# Patient Record
Sex: Female | Born: 1975 | Race: Black or African American | Hispanic: No | Smoking: Current every day smoker
Health system: Southern US, Community
[De-identification: ages and names within clinical notes are randomized; demographics above are authoritative.]

## PROBLEM LIST (undated history)

## (undated) ENCOUNTER — Inpatient Hospital Stay (HOSPITAL_COMMUNITY): Payer: Self-pay

## (undated) DIAGNOSIS — Z789 Other specified health status: Secondary | ICD-10-CM

## (undated) HISTORY — DX: Other specified health status: Z78.9

## (undated) HISTORY — PX: NO PAST SURGERIES: SHX2092

---

## 2001-10-04 ENCOUNTER — Emergency Department (HOSPITAL_COMMUNITY): Admission: EM | Admit: 2001-10-04 | Discharge: 2001-10-04 | Payer: Self-pay | Admitting: Emergency Medicine

## 2001-10-04 ENCOUNTER — Encounter: Payer: Self-pay | Admitting: Emergency Medicine

## 2002-06-01 ENCOUNTER — Emergency Department (HOSPITAL_COMMUNITY): Admission: EM | Admit: 2002-06-01 | Discharge: 2002-06-01 | Payer: Self-pay | Admitting: Emergency Medicine

## 2005-01-05 ENCOUNTER — Other Ambulatory Visit: Admission: RE | Admit: 2005-01-05 | Discharge: 2005-01-05 | Payer: Self-pay | Admitting: Gynecology

## 2005-02-07 ENCOUNTER — Emergency Department (HOSPITAL_COMMUNITY): Admission: EM | Admit: 2005-02-07 | Discharge: 2005-02-07 | Payer: Self-pay | Admitting: Emergency Medicine

## 2005-10-06 ENCOUNTER — Other Ambulatory Visit: Admission: RE | Admit: 2005-10-06 | Discharge: 2005-10-06 | Payer: Self-pay | Admitting: Gynecology

## 2006-06-21 ENCOUNTER — Emergency Department (HOSPITAL_COMMUNITY): Admission: EM | Admit: 2006-06-21 | Discharge: 2006-06-21 | Payer: Self-pay | Admitting: Family Medicine

## 2007-07-28 ENCOUNTER — Emergency Department (HOSPITAL_COMMUNITY): Admission: EM | Admit: 2007-07-28 | Discharge: 2007-07-28 | Payer: Self-pay | Admitting: Emergency Medicine

## 2007-12-16 ENCOUNTER — Emergency Department (HOSPITAL_COMMUNITY): Admission: EM | Admit: 2007-12-16 | Discharge: 2007-12-16 | Payer: Self-pay | Admitting: Emergency Medicine

## 2007-12-17 ENCOUNTER — Emergency Department (HOSPITAL_COMMUNITY): Admission: EM | Admit: 2007-12-17 | Discharge: 2007-12-17 | Payer: Self-pay | Admitting: Emergency Medicine

## 2007-12-24 ENCOUNTER — Emergency Department (HOSPITAL_COMMUNITY): Admission: EM | Admit: 2007-12-24 | Discharge: 2007-12-24 | Payer: Self-pay | Admitting: Emergency Medicine

## 2008-11-07 ENCOUNTER — Emergency Department (HOSPITAL_COMMUNITY): Admission: EM | Admit: 2008-11-07 | Discharge: 2008-11-07 | Payer: Self-pay | Admitting: Emergency Medicine

## 2009-01-20 ENCOUNTER — Emergency Department (HOSPITAL_COMMUNITY): Admission: EM | Admit: 2009-01-20 | Discharge: 2009-01-20 | Payer: Self-pay | Admitting: Family Medicine

## 2009-10-30 ENCOUNTER — Emergency Department (HOSPITAL_COMMUNITY): Admission: EM | Admit: 2009-10-30 | Discharge: 2009-10-30 | Payer: Self-pay | Admitting: Family Medicine

## 2010-06-26 LAB — COMPREHENSIVE METABOLIC PANEL
ALT: 12 U/L (ref 0–35)
BUN: 12 mg/dL (ref 6–23)
CO2: 25 mEq/L (ref 19–32)
Calcium: 9.3 mg/dL (ref 8.4–10.5)
Creatinine, Ser: 0.8 mg/dL (ref 0.4–1.2)
GFR calc non Af Amer: >60 mL/min (ref >60)
Glucose, Bld: 96 mg/dL (ref 70–99)
Sodium: 138 mEq/L (ref 135–145)

## 2010-06-26 LAB — URINALYSIS, ROUTINE W REFLEX MICROSCOPIC
Bilirubin Urine: NEGATIVE
Glucose, UA: NEGATIVE mg/dL
Hgb urine dipstick: NEGATIVE
Ketones, ur: 40 mg/dL — AB
Nitrite: NEGATIVE
Protein, ur: NEGATIVE mg/dL
Specific Gravity, Urine: 1.03 (ref 1.005–1.030)
Urobilinogen, UA: 1 mg/dL (ref 0.0–1.0)
pH: 6 (ref 5.0–8.0)

## 2010-06-26 LAB — CBC
HCT: 37.5 % (ref 36.0–46.0)
Hemoglobin: 12.7 g/dL (ref 12.0–15.0)
MCHC: 33.9 g/dL (ref 30.0–36.0)
MCV: 85.2 fL (ref 78.0–100.0)
RBC: 4.4 MIL/uL (ref 3.87–5.11)

## 2010-06-26 LAB — DIFFERENTIAL
Eosinophils Absolute: 0.1 10*3/uL (ref 0.0–0.7)
Lymphs Abs: 1.2 10*3/uL (ref 0.7–4.0)
Neutrophils Relative %: 60 % (ref 43–77)

## 2010-06-26 LAB — GC/CHLAMYDIA PROBE AMP, GENITAL
Chlamydia, DNA Probe: NEGATIVE
GC Probe Amp, Genital: NEGATIVE

## 2010-07-21 ENCOUNTER — Emergency Department (HOSPITAL_COMMUNITY): Payer: Self-pay

## 2010-07-21 ENCOUNTER — Emergency Department (HOSPITAL_COMMUNITY)
Admission: EM | Admit: 2010-07-21 | Discharge: 2010-07-21 | Disposition: A | Discharge status: Home / Self Care | Payer: Self-pay | Attending: Emergency Medicine | Admitting: Emergency Medicine

## 2010-07-21 DIAGNOSIS — K029 Dental caries, unspecified: Secondary | ICD-10-CM | POA: Insufficient documentation

## 2010-07-21 DIAGNOSIS — F3289 Other specified depressive episodes: Secondary | ICD-10-CM | POA: Insufficient documentation

## 2010-07-21 DIAGNOSIS — R221 Localized swelling, mass and lump, neck: Secondary | ICD-10-CM | POA: Insufficient documentation

## 2010-07-21 DIAGNOSIS — Z7982 Long term (current) use of aspirin: Secondary | ICD-10-CM | POA: Insufficient documentation

## 2010-07-21 DIAGNOSIS — K089 Disorder of teeth and supporting structures, unspecified: Secondary | ICD-10-CM | POA: Insufficient documentation

## 2010-07-21 DIAGNOSIS — R220 Localized swelling, mass and lump, head: Secondary | ICD-10-CM | POA: Insufficient documentation

## 2010-07-21 DIAGNOSIS — F329 Major depressive disorder, single episode, unspecified: Secondary | ICD-10-CM | POA: Insufficient documentation

## 2012-06-14 ENCOUNTER — Encounter (HOSPITAL_COMMUNITY): Payer: Self-pay | Admitting: *Deleted

## 2012-06-14 ENCOUNTER — Emergency Department (INDEPENDENT_AMBULATORY_CARE_PROVIDER_SITE_OTHER)
Admission: EM | Admit: 2012-06-14 | Discharge: 2012-06-14 | Disposition: A | Discharge status: Home / Self Care | Payer: Self-pay | Source: Home / Self Care | Attending: Family Medicine | Admitting: Family Medicine

## 2012-06-14 DIAGNOSIS — H00015 Hordeolum externum left lower eyelid: Secondary | ICD-10-CM

## 2012-06-14 DIAGNOSIS — H00019 Hordeolum externum unspecified eye, unspecified eyelid: Secondary | ICD-10-CM

## 2012-06-14 DIAGNOSIS — K047 Periapical abscess without sinus: Secondary | ICD-10-CM

## 2012-06-14 MED ORDER — TRAMADOL HCL 50 MG PO TABS: 50.0000 mg | ORAL_TABLET | Freq: Four times a day (QID) | ORAL | Status: DC | PRN

## 2012-06-14 MED ORDER — ERYTHROMYCIN 5 MG/GM OP OINT: TOPICAL_OINTMENT | OPHTHALMIC | Status: DC

## 2012-06-14 MED ORDER — IBUPROFEN 600 MG PO TABS: 600.0000 mg | ORAL_TABLET | Freq: Three times a day (TID) | ORAL | Status: DC | PRN

## 2012-06-14 MED ORDER — PENICILLIN V POTASSIUM 500 MG PO TABS: 500.0000 mg | ORAL_TABLET | Freq: Four times a day (QID) | ORAL | Status: AC

## 2012-06-14 NOTE — ED Provider Notes (Signed)
History     CSN: [id number]  Arrival date & time 06/14/12  1024   First MD Initiated Contact with Patient 06/14/12 1042      Chief Complaint  Patient presents with  . Dental Problem    (Consider location/radiation/quality/duration/timing/severity/associated sxs/prior treatment) HPI Comments: 37 year old nondiabetic female here complaining of right lower jaw pain and gum swelling for 2 days. She has a cavity in the posterior molar for several months desk is intermittent dental pain. Denies face swelling, fever or chills. Also patient noticed an area in her left lower lid initially with itchiness and now with mild swelling and tenderness for 1 day, no spontaneous drainage.Marland Kitchen   History reviewed. No pertinent past medical history.  History reviewed. No pertinent past surgical history.  History reviewed. No pertinent family history.  History  Substance Use Topics  . Smoking status: Current Every Day Smoker  . Smokeless tobacco: Not on file  . Alcohol Use: Yes    OB History   Grav Para Term Preterm Abortions TAB SAB Ect Mult Living                  Review of Systems  Constitutional: Negative for fever and chills.  HENT: Positive for dental problem. Negative for congestion.   Eyes: Negative for photophobia, discharge, redness and visual disturbance.       Left eyelid itchiness and pain as per HPI  Respiratory: Negative for cough, shortness of breath and wheezing.   Gastrointestinal: Negative for nausea and vomiting.  Skin: Negative for rash.  Neurological: Negative for dizziness and headaches.    Allergies  Review of patient's allergies indicates no known allergies.  Home Medications   Current Outpatient Rx  Name  Route  Sig  Dispense  Refill  . erythromycin ophthalmic ointment      Place a 1/2 inch ribbon of ointment into the left lower eyelid TID for 5-7 days.   1 g   0   . ibuprofen (ADVIL,MOTRIN) 600 MG tablet   Oral   Take 1 tablet (600 mg total) by mouth  every 8 (eight) hours as needed for pain.   20 tablet   0   . penicillin v potassium (VEETID) 500 MG tablet   Oral   Take 1 tablet (500 mg total) by mouth 4 (four) times daily.   40 tablet   0   . traMADol (ULTRAM) 50 MG tablet   Oral   Take 1 tablet (50 mg total) by mouth every 6 (six) hours as needed for pain.   15 tablet   0     BP 155/97  Pulse 67  Temp(Src) 98.1 F (36.7 C) (Oral)  Resp 16  SpO2 97%  LMP 06/02/2012  Physical Exam  Nursing note and vitals reviewed. Constitutional: She is oriented to person, place, and time. She appears well-developed and well-nourished. No distress.  HENT:  Head: Normocephalic and atraumatic.  Right lower posterior molar with large cavity and associated gum swelling and tenderness.  Eyes: Conjunctivae and EOM are normal. Pupils are equal, round, and reactive to light. Right eye exhibits no discharge. Left eye exhibits no discharge. No scleral icterus.  Left lower eyelid with a focal area of mild swelling and tenderness over lid margin. No significant blepharitis. No periorbital swelling, induration tenderness or redness.  Neck: Neck supple.  Cardiovascular: Normal heart sounds.   Pulmonary/Chest: Breath sounds normal.  Lymphadenopathy:    She has no cervical adenopathy.  Neurological: She is alert and oriented to  person, place, and time.  Skin: No rash noted. She is not diaphoretic.    ED Course  Procedures (including critical care time)  Labs Reviewed - No data to display No results found.   1. Dental abscess   2. Hordeolum externum of left lower eyelid       MDM  Treated with penicillin, ibuprofen and tramadol.  Patient states she has a dentist that accepts her Medicaid and she plans to schedule a followup with him. Prescribed erythromycin ophthalmic ointment. Supportive care with oxygen return to medical attention discussed with patient and provided in writing.        Sharin Grave, MD 06/15/12 1224

## 2012-06-14 NOTE — ED Notes (Signed)
Pt  Has two  Complaints  She  Reports   A  Swelling to left  Lowe  Eyelid   X  1  Day    And   painfull  r  Lower  Jaw  With  Some   Numbness  In that  Area  For  2  Days      -   She  Is  Sitting  Upright on  Exam table  Speaking in  Complete  sentances   And  Is  In no acute  Distress

## 2015-03-22 NOTE — L&D Delivery Note (Addendum)
Patient is a 40 y.o. now G2P2002 who presented with SOL, now s/p SVD.   Delivery Note At 1:27 AM a viable female was delivered via Vaginal, Spontaneous Delivery. Head delivered LOA. Nuchal cord x 1 present, shoulders and body easily delivered through. Infant to mother's body. Cord clamped x 2 after 1-minute delay, and cut by mother. Cord blood drawn. Placenta delivered spontaneously, intact. Fundus firm with massage and Pitocin. Perineal inspected and found to be intact..    APGAR: pending; weight pending.   Placenta status: intact, with calcifications.  Cord: 3-vessel  Anesthesia: Epidural   Episiotomy: None Lacerations: None Suture Repair: none Est. Blood Loss (mL):  150  Mom to postpartum.  Baby to Couplet care / Skin to Skin.  [name] 12/02/2015, 1:47 AM    OB FELLOW DELIVERY ATTESTATION  I was gloved and present for the delivery in its entirety, and I agree with the above resident's note.    Jen MowElizabeth Jolin Benavides, DO OB Fellow 10:17 AM

## 2015-07-22 ENCOUNTER — Encounter: Payer: Self-pay | Admitting: Student

## 2015-08-10 ENCOUNTER — Ambulatory Visit (INDEPENDENT_AMBULATORY_CARE_PROVIDER_SITE_OTHER): Payer: Medicaid Other | Admitting: Obstetrics & Gynecology

## 2015-08-10 ENCOUNTER — Other Ambulatory Visit (HOSPITAL_COMMUNITY)
Admission: RE | Admit: 2015-08-10 | Discharge: 2015-08-10 | Disposition: A | Discharge status: Home / Self Care | Payer: Medicaid Other | Source: Ambulatory Visit | Attending: Obstetrics & Gynecology | Admitting: Obstetrics & Gynecology

## 2015-08-10 ENCOUNTER — Other Ambulatory Visit: Payer: Self-pay | Admitting: Obstetrics & Gynecology

## 2015-08-10 ENCOUNTER — Encounter: Payer: Self-pay | Admitting: Obstetrics & Gynecology

## 2015-08-10 VITALS — BP 120/66 | HR 77 | Ht 64.0 in | Wt 131.0 lb

## 2015-08-10 DIAGNOSIS — O09529 Supervision of elderly multigravida, unspecified trimester: Secondary | ICD-10-CM | POA: Insufficient documentation

## 2015-08-10 DIAGNOSIS — Z1151 Encounter for screening for human papillomavirus (HPV): Secondary | ICD-10-CM | POA: Diagnosis not present

## 2015-08-10 DIAGNOSIS — O0992 Supervision of high risk pregnancy, unspecified, second trimester: Secondary | ICD-10-CM

## 2015-08-10 DIAGNOSIS — O09522 Supervision of elderly multigravida, second trimester: Secondary | ICD-10-CM | POA: Diagnosis not present

## 2015-08-10 DIAGNOSIS — Z01419 Encounter for gynecological examination (general) (routine) without abnormal findings: Secondary | ICD-10-CM | POA: Diagnosis present

## 2015-08-10 DIAGNOSIS — Z113 Encounter for screening for infections with a predominantly sexual mode of transmission: Secondary | ICD-10-CM | POA: Insufficient documentation

## 2015-08-10 DIAGNOSIS — O0932 Supervision of pregnancy with insufficient antenatal care, second trimester: Secondary | ICD-10-CM | POA: Diagnosis present

## 2015-08-10 DIAGNOSIS — O3680X0 Pregnancy with inconclusive fetal viability, not applicable or unspecified: Secondary | ICD-10-CM | POA: Insufficient documentation

## 2015-08-10 LAB — POCT URINALYSIS DIP (DEVICE)
BILIRUBIN URINE: NEGATIVE
Glucose, UA: NEGATIVE mg/dL
Hgb urine dipstick: NEGATIVE
Ketones, ur: NEGATIVE mg/dL
Nitrite: NEGATIVE
PH: 6 (ref 5.0–8.0)
Protein, ur: NEGATIVE mg/dL
Specific Gravity, Urine: 1.025 (ref 1.005–1.030)
UROBILINOGEN UA: 1 mg/dL (ref 0.0–1.0)

## 2015-08-10 NOTE — Progress Notes (Signed)
U/S and genetic counseling scheduled 06/02 @ 1pm.

## 2015-08-10 NOTE — Progress Notes (Signed)
   Subjective:    Shelly Hodges is a G2P1001 8410w4d being seen today for her first obstetrical visit.  Her obstetrical history is significant for advanced maternal age and late prenatal care. Patient does intend to breast feed. Pregnancy history fully reviewed.  Patient reports no complaints.  Filed Vitals:   08/10/15 0939 08/10/15 0940  BP: 120/66   Pulse: 77   Height:  5\' 4"  (1.626 m)  Weight: 131 lb (59.421 kg)     HISTORY: OB History  Gravida Para Term Preterm AB SAB TAB Ectopic Multiple Living  2 1 1       1     # Outcome Date GA Lbr Len/2nd Weight Sex Delivery Anes PTL Lv  2 Current           1 Term 10/20/95 682w0d  5 lb 11 oz (2.58 kg) M Vag-Spont EPI  Y     Past Medical History  Diagnosis Date  . Medical history non-contributory    Past Surgical History  Procedure Laterality Date  . No past surgeries     History reviewed. No pertinent family history.   Exam    Uterus:  Fundal Height: 21 cm  Pelvic Exam:    Perineum: Normal Perineum   Vulva: normal   Vagina:  normal mucosa   pH: n/a   Cervix: no lesions   Adnexa: normal adnexa   Bony Pelvis: average  System: Breast:  pt refuses breast exam at this time   Skin: normal coloration and turgor, no rashes    Neurologic: oriented, normal mood   Extremities: no deformities   HEENT oropharynx clear, no lesions, neck supple with midline trachea and thyroid without masses   Mouth/Teeth mucous membranes moist, pharynx normal without lesions and dental hygiene poor   Neck supple and no masses   Cardiovascular: regular rate and rhythm   Respiratory:  appears well, vitals normal, no respiratory distress, acyanotic, normal RR, chest clear, no wheezing, crepitations, rhonchi, normal symmetric air entry   Abdomen: soft, non-tender; bowel sounds normal; no masses,  no organomegaly   Urinary: urethral meatus normal      Assessment:    Pregnancy: G2P1001 Patient Active Problem List   Diagnosis Date Noted  .  Antepartum multigravida of advanced maternal age 42/22/2017  . Supervision of high risk pregnancy, antepartum 08/10/2015        Plan:     Initial labs drawn. Prenatal vitamins. Problem list reviewed and updated. Genetic Screening discussed and patient wants to have NIPS and genetic counseling  Ultrasound discussed; fetal survey: ordered. Early glucola due to AMA Pap smear with co testing today  Follow up in 4 weeks.    Mammie Meras H. 08/10/2015

## 2015-08-11 LAB — CYTOLOGY - PAP

## 2015-08-11 LAB — GLUCOSE TOLERANCE, 1 HOUR (50G) W/O FASTING: Glucose, 1 Hr, gestational: 89 mg/dL (ref <140)

## 2015-08-11 LAB — GC/CHLAMYDIA PROBE AMP (~~LOC~~) NOT AT ARMC
Chlamydia: NEGATIVE
NEISSERIA GONORRHEA: NEGATIVE

## 2015-08-12 LAB — PRENATAL PROFILE (SOLSTAS)
Antibody Screen: NEGATIVE
BASOS PCT: 0 %
Basophils Absolute: 0 cells/uL (ref 0–200)
EOS PCT: 2 %
Eosinophils Absolute: 138 cells/uL (ref 15–500)
HEMATOCRIT: 31.1 % — AB (ref 35.0–45.0)
HEMOGLOBIN: 10.1 g/dL — AB (ref 11.7–15.5)
HIV 1&2 Ab, 4th Generation: NONREACTIVE
Hepatitis B Surface Ag: NEGATIVE
LYMPHS ABS: 1380 {cells}/uL (ref 850–3900)
LYMPHS PCT: 20 %
MCH: 26.7 pg — ABNORMAL LOW (ref 27.0–33.0)
MCHC: 32.5 g/dL (ref 32.0–36.0)
MCV: 82.3 fL (ref 80.0–100.0)
MPV: 10.3 fL (ref 7.5–12.5)
Monocytes Absolute: 483 cells/uL (ref 200–950)
Monocytes Relative: 7 %
NEUTROS PCT: 71 %
Neutro Abs: 4899 cells/uL (ref 1500–7800)
PLATELETS: 274 10*3/uL (ref 140–400)
RBC: 3.78 MIL/uL — AB (ref 3.80–5.10)
RDW: 15.1 % — AB (ref 11.0–15.0)
RH TYPE: POSITIVE
Rubella: 2.68 Index — ABNORMAL HIGH (ref <0.90)
WBC: 6.9 10*3/uL (ref 3.8–10.8)

## 2015-08-12 LAB — CULTURE, OB URINE

## 2015-08-14 ENCOUNTER — Encounter: Payer: Self-pay | Admitting: *Deleted

## 2015-08-14 ENCOUNTER — Other Ambulatory Visit: Payer: Self-pay | Admitting: Obstetrics & Gynecology

## 2015-08-14 LAB — ZOLPIDEM QN, U
ZOLPIDEM METABOLITE: NEGATIVE ng/mL (ref <5)
ZOLPIDEM: NEGATIVE ng/mL (ref <5)

## 2015-08-14 LAB — PAIN MGMT, TRAMADOL QN, U
DESMETHYLTRAMADOL: NEGATIVE ng/mL (ref <100)
TRAMADOL: NEGATIVE ng/mL (ref <100)

## 2015-08-14 LAB — PAIN MGMT, FENTANYL QN, U
Fentanyl: NEGATIVE ng/mL (ref <0.5)
Norfentanyl: NEGATIVE ng/mL (ref <0.5)

## 2015-08-14 LAB — PAIN MGMT, MEPERIDINE QN, U
Meperidine: NEGATIVE ng/mL (ref <100)
Normeperidine: NEGATIVE ng/mL (ref <100)

## 2015-08-14 LAB — PAIN MGMT, CARISOPRODOL MET. QN, U: MEPROBAMATE: NEGATIVE ng/mL (ref <1000)

## 2015-08-14 MED ORDER — METRONIDAZOLE 500 MG PO TABS: ORAL_TABLET | ORAL | Status: DC

## 2015-08-15 LAB — PAIN MGMT, AMPHETAM. W/CONF, U: Amphetamines: NEGATIVE ng/mL (ref <500)

## 2015-08-15 LAB — PAIN MGMT, PROPOXYPHENE W/CONF, U: PROPOXYPHENE: NEGATIVE ng/mL (ref <300)

## 2015-08-15 LAB — PAIN MGMT, HEROIN MET. W/CONF, U: 6 ACETYLMORPHINE: NEGATIVE ng/mL (ref <10)

## 2015-08-15 LAB — PAIN MGMT, OPIATES W/CONF, U: OPIATES: NEGATIVE ng/mL (ref <100)

## 2015-08-15 LAB — PAIN MGMT, BUP CONF W/ NALOX, U: Buprenorphine: NEGATIVE ng/mL (ref <5)

## 2015-08-15 LAB — PAIN MGMT, METHADONE W/CONF, U: Methadone Metabolite: NEGATIVE ng/mL (ref <100)

## 2015-08-15 LAB — PAIN MGMT, OXYCODONE W/CONF, U: OXYCODONE: NEGATIVE ng/mL (ref <100)

## 2015-08-15 LAB — PAIN MGMT, BENZOS W/CONF, U: BENZODIAZEPINES: NEGATIVE ng/mL (ref <100)

## 2015-08-15 LAB — PAIN MGMT, BARBITURATES W/CONF, U: BARBITURATES: NEGATIVE ng/mL (ref <300)

## 2015-08-15 LAB — PAIN MGMT, MARIJUANA W/CONF, U
MARIJUANA METABOLITE: 1210 ng/mL — AB (ref <5)
Marijuana Metabolite: POSITIVE ng/mL — AB (ref <20)

## 2015-08-15 LAB — PAIN MGMT, COCAINE MET. W/CONF, U: COCAINE METABOLITE: NEGATIVE ng/mL (ref <150)

## 2015-08-16 LAB — CANNABANOIDS (GC/LC/MS), URINE: THC-COOH UR CONFIRM: 829 ng/mL — AB (ref <5)

## 2015-08-18 ENCOUNTER — Ambulatory Visit (HOSPITAL_COMMUNITY): Payer: Medicaid Other

## 2015-08-18 LAB — PRESCRIPTION MONITORING PROFILE (19 PANEL)
AMPHETAMINE/METH: NEGATIVE ng/mL
BARBITURATE SCREEN, URINE: NEGATIVE ng/mL
Benzodiazepine Screen, Urine: NEGATIVE ng/mL
Buprenorphine, Urine: NEGATIVE ng/mL
CARISOPRODOL, URINE: NEGATIVE ng/mL
Cocaine Metabolites: NEGATIVE ng/mL
Creatinine, Urine: 172.49 mg/dL (ref >20.0)
Fentanyl, Ur: NEGATIVE ng/mL
MDMA URINE: NEGATIVE ng/mL
MEPERIDINE UR: NEGATIVE ng/mL
METHADONE SCREEN, URINE: NEGATIVE ng/mL
Methaqualone: NEGATIVE ng/mL
NITRITES URINE, INITIAL: NEGATIVE ug/mL
OPIATE SCREEN, URINE: NEGATIVE ng/mL
OXYCODONE SCRN UR: NEGATIVE ng/mL
PHENCYCLIDINE, UR: NEGATIVE ng/mL
PROPOXYPHENE: NEGATIVE ng/mL
TAPENTADOLUR: NEGATIVE ng/mL
TRAMADOL UR: NEGATIVE ng/mL
Zolpidem, Urine: NEGATIVE ng/mL
pH, Initial: 6.3 pH (ref 4.5–8.9)

## 2015-08-19 ENCOUNTER — Encounter: Payer: Self-pay | Admitting: Obstetrics & Gynecology

## 2015-08-19 DIAGNOSIS — F1290 Cannabis use, unspecified, uncomplicated: Secondary | ICD-10-CM | POA: Insufficient documentation

## 2015-08-19 NOTE — Progress Notes (Signed)
Called patient & informed of her medication sent to pharmacy & results. Also discussed abstaining from intercourse & partner treatment. Patient verbalized understanding & had no questions

## 2015-08-21 ENCOUNTER — Encounter (HOSPITAL_COMMUNITY): Payer: Self-pay

## 2015-08-21 ENCOUNTER — Ambulatory Visit (HOSPITAL_COMMUNITY)
Admission: RE | Admit: 2015-08-21 | Discharge: 2015-08-21 | Disposition: A | Discharge status: Home / Self Care | Payer: Medicaid Other | Source: Ambulatory Visit | Attending: Obstetrics & Gynecology | Admitting: Obstetrics & Gynecology

## 2015-08-21 DIAGNOSIS — O0992 Supervision of high risk pregnancy, unspecified, second trimester: Secondary | ICD-10-CM

## 2015-08-21 DIAGNOSIS — O0932 Supervision of pregnancy with insufficient antenatal care, second trimester: Secondary | ICD-10-CM

## 2015-08-21 DIAGNOSIS — Z3A25 25 weeks gestation of pregnancy: Secondary | ICD-10-CM | POA: Diagnosis not present

## 2015-08-21 DIAGNOSIS — O09522 Supervision of elderly multigravida, second trimester: Secondary | ICD-10-CM

## 2015-08-21 DIAGNOSIS — O09529 Supervision of elderly multigravida, unspecified trimester: Secondary | ICD-10-CM

## 2015-08-21 DIAGNOSIS — O0990 Supervision of high risk pregnancy, unspecified, unspecified trimester: Secondary | ICD-10-CM

## 2015-08-21 NOTE — Progress Notes (Signed)
Genetic Counseling  High-Risk Gestation Note  Appointment Date:  08/21/2015 Referred By: Shelly Dukes, MD Date of Birth:  13-Oct-1975   Pregnancy History: G2P1001 Estimated Date of Delivery: 12/03/15 Estimated Gestational Age: [redacted]w[redacted]d Attending: Ledon Snare, MD  [name] was seen for genetic counseling because of a maternal age of 40 y.o..     In summary:  Discussed AMA and associated risk for fetal aneuploidy  Discussed options for screening  NIPS-declined  Ultrasound-within normal limits today; complete report under separate cover  Discussed diagnostic testing options  Amniocentesis-patient declined  Reviewed family history concerns: two maternal aunts with sickle cell trait  Discussed carrier screening options  CF-patient declined  SMA-patient declined  Hemoglobinopathies- patient accepted; Hemoglobin electrophoresis performed today  She was counseled regarding maternal age and the association with risk for chromosome conditions due to nondisjunction with aging of the ova.   We reviewed chromosomes, nondisjunction, and the associated 1 in 33 risk for fetal aneuploidy related to a maternal age of 40 y.o. at [redacted]w[redacted]d gestation.  She was counseled that the risk for aneuploidy decreases as gestational age increases, accounting for those pregnancies which spontaneously abort.  We specifically discussed Down syndrome (trisomy 55), trisomies 57 and 36, and sex chromosome aneuploidies (47,XXX and 47,XXY) including the common features and prognoses of each.   We reviewed available screening options including noninvasive prenatal screening (NIPS)/cell free DNA (cfDNA) screening and detailed ultrasound.  She was counseled that screening tests are used to modify a patient's a priori risk for aneuploidy, typically based on age. This estimate provides a pregnancy specific risk assessment. We reviewed the benefits and limitations of each option. Specifically, we discussed the  conditions for which each test screens, the detection rates, and false positive rates of each. She was also counseled regarding diagnostic testing via amniocentesis. We reviewed the approximate 1 in 300-500 risk for complications from amniocentesis, including spontaneous preterm labor and delivery. We discussed the possible results that the tests might provide including: positive, negative, unanticipated, and no result. Finally, they were counseled regarding the cost of each option and potential out of pocket expenses.  Detailed ultrasound was performed today. Visualized fetal anatomy was within normal range. Complete ultrasound results reported separately.    After consideration of all the options, she declined NIPS and amniocentesis. She understands that screening tests cannot rule out all birth defects or genetic syndromes. The patient was advised of this limitation and states she still does not want additional testing at this time.   [name] was provided with written information regarding sickle cell anemia (SCA) including the carrier frequency and incidence in the African-American population, the availability of carrier testing and prenatal diagnosis if indicated. She reported that she has two maternal aunts and one maternal first cousin with sickle cell trait. The father of the pregnancy does not have known relatives with sickle cell trait, and consanguinity was denied for the couple. We reviewed the autosomal recessive inheritance of hemoglobin variants, such as sickle cell trait, and that prior to carrier screening, Shelly Hodges would have a 1 in 4 (25%) chance to have sickle cell trait given the reported family history. We reviewed that carrier screening is available to Shelly Hodges via hemoglobin electrophoresis.  In addition, we discussed that hemoglobinopathies are routinely screened for as part of the [place] [facility] newborn screening panel.  She elected to pursue hemoglobin electrophoresis  today.  [name] was provided with written information regarding cystic fibrosis (CF) including  the carrier frequency and incidence in the African American population, the availability of carrier testing and prenatal diagnosis if indicated.  In addition, we discussed that CF is routinely screened for as part of the [facility] newborn screening panel.  She declined CF testing today. We also discussed the option of carrier screening for Spinal Muscular Atrophy. We reviewed the autosomal recessive inheritance, general population carrier frequency, and variable features of SMA as a condition. She declined SMA carrier screening today.   Both family histories were reviewed and found to be otherwise noncontributory for birth defects, intellectual disability, and known genetic conditions. Without further information regarding the provided family history, an accurate genetic risk cannot be calculated. Further genetic counseling is warranted if more information is obtained.  [name] denied exposure to environmental toxins or chemical agents. She denied the use of alcohol, tobacco or street drugs. She denied significant viral illnesses during the course of her pregnancy. Her medical and surgical histories were noncontributory.   I counseled [name] regarding the above risks and available options.  The approximate face-to-face time with the genetic counselor was 35 minutes.  Quinn PlowmanKaren Cassell Voorhies, MS,  Certified Genetic Counselor 08/21/2015

## 2015-08-25 LAB — HEMOGLOBINOPATHY EVALUATION
HGB A: 97.5 % (ref 94.0–98.0)
HGB F QUANT: 0 % (ref 0.0–2.0)
HGB S QUANTITAION: 0 %
Hgb A2 Quant: 1.1 % (ref 0.7–3.1)
Hgb C: 0 %

## 2015-08-31 ENCOUNTER — Telehealth (HOSPITAL_COMMUNITY): Payer: Self-pay | Admitting: *Deleted

## 2015-08-31 NOTE — Telephone Encounter (Signed)
Pt called, name and DOB verified, results of lab given.  Pt understanding, no questions at this time.  Dr. Sherrie Georgeecker rev'd lab results.

## 2015-09-07 ENCOUNTER — Ambulatory Visit (INDEPENDENT_AMBULATORY_CARE_PROVIDER_SITE_OTHER): Payer: Medicaid Other | Admitting: Obstetrics & Gynecology

## 2015-09-07 VITALS — BP 126/66 | HR 72 | Wt 134.2 lb

## 2015-09-07 DIAGNOSIS — O0992 Supervision of high risk pregnancy, unspecified, second trimester: Secondary | ICD-10-CM

## 2015-09-07 DIAGNOSIS — O09522 Supervision of elderly multigravida, second trimester: Secondary | ICD-10-CM | POA: Diagnosis not present

## 2015-09-07 DIAGNOSIS — Z23 Encounter for immunization: Secondary | ICD-10-CM | POA: Diagnosis not present

## 2015-09-07 LAB — CBC
HEMATOCRIT: 30.6 % — AB (ref 35.0–45.0)
HEMOGLOBIN: 9.9 g/dL — AB (ref 11.7–15.5)
MCH: 25.9 pg — ABNORMAL LOW (ref 27.0–33.0)
MCHC: 32.4 g/dL (ref 32.0–36.0)
MCV: 80.1 fL (ref 80.0–100.0)
MPV: 10.4 fL (ref 7.5–12.5)
Platelets: 280 10*3/uL (ref 140–400)
RBC: 3.82 MIL/uL (ref 3.80–5.10)
RDW: 14.8 % (ref 11.0–15.0)
WBC: 6.9 10*3/uL (ref 3.8–10.8)

## 2015-09-07 LAB — POCT URINALYSIS DIP (DEVICE)
BILIRUBIN URINE: NEGATIVE
GLUCOSE, UA: NEGATIVE mg/dL
Hgb urine dipstick: NEGATIVE
KETONES UR: NEGATIVE mg/dL
LEUKOCYTES UA: NEGATIVE
Nitrite: NEGATIVE
Protein, ur: 30 mg/dL — AB
SPECIFIC GRAVITY, URINE: 1.025 (ref 1.005–1.030)
Urobilinogen, UA: 1 mg/dL (ref 0.0–1.0)
pH: 7 (ref 5.0–8.0)

## 2015-09-07 LAB — GLUCOSE TOLERANCE, 1 HOUR (50G) W/O FASTING: GLUCOSE, 1 HR, GESTATIONAL: 130 mg/dL (ref <140)

## 2015-09-07 LAB — HIV ANTIBODY (ROUTINE TESTING W REFLEX): HIV: NONREACTIVE

## 2015-09-07 NOTE — Patient Instructions (Signed)
Sterilization Information, Female Female sterilization is a procedure to permanently prevent pregnancy. There are different ways to perform sterilization, but all either block or close the fallopian tubes so that your eggs cannot reach your uterus. If your egg cannot reach your uterus, sperm cannot fertilize the egg, and you cannot get pregnant.  Sterilization is performed by a surgical procedure. Sometimes these procedures are performed in a hospital while a patient is asleep. Sometimes they can be done in a clinic setting with the patient awake. The fallopian tubes can be surgically cut, tied, or sealed through a procedure called tubal ligation. The fallopian tubes can also be closed with clips or rings. Sterilization can also be done by placing a tiny coil into each fallopian tube, which causes scar tissue to grow inside the tube. The scar tissue then blocks the tubes.  Discuss sterilization with your caregiver to answer any concerns you or your partner may have. You may want to ask what type of sterilization your caregiver performs. Some caregivers may not perform all the various options. Sterilization is permanent and should only be done if you are sure you do not want children or do not want any more children. Having a sterilization reversed may not be successful.  STERILIZATION PROCEDURES  Laparoscopic sterilization. This is a surgical method performed at a time other than right after childbirth. Two incisions are made in the lower abdomen. A thin, lighted tube (laparoscope) is inserted into one of the incisions and is used to perform the procedure. The fallopian tubes are closed with a ring or a clip. An instrument that uses heat could be used to seal the tubes closed (electrocautery).   Mini-laparotomy. This is a surgical method done 1 or 2 days after giving birth. Typically, a small incision is made just below the belly button (umbilicus) and the fallopian tubes are exposed. The tubes can then be  sealed, tied, or cut.   Hysteroscopic sterilization. This is performed at a time other than right after childbirth. A tiny, spring-like coil is inserted through the cervix and uterus and placed into the fallopian tubes. The coil causes scaring and blocks the tubes. Other forms of contraception should be used for 3 months after the procedure to allow the scar tissue to form completely. Additionally, it is required hysterosalpingography be done 3 months later to ensure that the procedure was successful. Hysterosalpingography is a procedure that uses X-rays to look at your uterus and fallopian tubes after a material to make them show up better has been inserted. IS STERILIZATION SAFE? Sterilization is considered safe with very rare complications. Risks depend on the type of procedure you have. As with any surgical procedure, there are risks. Some risks of sterilization by any means include:   Bleeding.  Infection.  Reaction to anesthesia medicine.  Injury to surrounding organs. Risks specific to having hysteroscopic coils placed include:  The coils may not be placed correctly the first time.   The coils may move out of place.   The tubes may not get completely blocked after 3 months.   Injury to surrounding organs when placing the coil.  HOW EFFECTIVE IS FEMALE STERILIZATION? Sterilization is nearly 100% effective, but it can fail. Depending on the type of sterilization, the rate of failure can be as high as 3%. After hysteroscopic sterilization with placement of fallopian tube coils, you will need back-up birth control for 3 months after the procedure. Sterilization is effective for a lifetime.  BENEFITS OF STERILIZATION  It does   not affect your hormones, and therefore will not affect your menstrual periods, sexual desire, or performance.   It is effective for a lifetime.   It is safe.   You do not need to worry about getting pregnant. Keep in mind that if you had the  hysteroscopic placement procedure, you must wait 3 months after the procedure (or until your caregiver confirms) before pregnancy is not considered possible.   There are no side effects unlike other types of birth control (contraception).  DRAWBACKS OF STERILIZATION  You must be sure you do not want children or any more children. The procedure is permanent.   It does not provide protection against sexually transmitted infections (STIs).   The tubes can grow back together. If this happens, there is a risk of pregnancy. There is also an increased risk (50%) of pregnancy being an ectopic pregnancy. This is a pregnancy that happens outside of the uterus.   This information is not intended to replace advice given to you by your health care provider. Make sure you discuss any questions you have with your health care provider.   Document Released: 08/24/2007 Document Revised: 03/12/2013 Document Reviewed: 06/23/2011 Elsevier Interactive Patient Education [employer] Contraception Choices Contraception (birth control) is the use of any methods or devices to prevent pregnancy. Below are some methods to help avoid pregnancy. HORMONAL METHODS   Contraceptive implant. This is a thin, plastic tube containing progesterone hormone. It does not contain estrogen hormone. Your health care provider inserts the tube in the inner part of the upper arm. The tube can remain in place for up to 3 years. After 3 years, the implant must be removed. The implant prevents the ovaries from releasing an egg (ovulation), thickens the cervical mucus to prevent sperm from entering the uterus, and thins the lining of the inside of the uterus.  Progesterone-only injections. These injections are given every 3 months by your health care provider to prevent pregnancy. This synthetic progesterone hormone stops the ovaries from releasing eggs. It also thickens cervical mucus and changes the uterine lining. This makes it  harder for sperm to survive in the uterus.  Birth control pills. These pills contain estrogen and progesterone hormone. They work by preventing the ovaries from releasing eggs (ovulation). They also cause the cervical mucus to thicken, preventing the sperm from entering the uterus. Birth control pills are prescribed by a health care provider.Birth control pills can also be used to treat heavy periods.  Minipill. This type of birth control pill contains only the progesterone hormone. They are taken every day of each month and must be prescribed by your health care provider.  Birth control patch. The patch contains hormones similar to those in birth control pills. It must be changed once a week and is prescribed by a health care provider.  Vaginal ring. The ring contains hormones similar to those in birth control pills. It is left in the vagina for 3 weeks, removed for 1 week, and then a new one is put back in place. The patient must be comfortable inserting and removing the ring from the vagina.A health care provider's prescription is necessary.  Emergency contraception. Emergency contraceptives prevent pregnancy after unprotected sexual intercourse. This pill can be taken right after sex or up to 5 days after unprotected sex. It is most effective the sooner you take the pills after having sexual intercourse. Most emergency contraceptive pills are available without a prescription. Check with your pharmacist. Do not use emergency contraception as  your only form of birth control. BARRIER METHODS   Female condom. This is a thin sheath (latex or rubber) that is worn over the penis during sexual intercourse. It can be used with spermicide to increase effectiveness.  Female condom. This is a soft, loose-fitting sheath that is put into the vagina before sexual intercourse.  Diaphragm. This is a soft, latex, dome-shaped barrier that must be fitted by a health care provider. It is inserted into the vagina,  along with a spermicidal jelly. It is inserted before intercourse. The diaphragm should be left in the vagina for 6 to 8 hours after intercourse.  Cervical cap. This is a round, soft, latex or plastic cup that fits over the cervix and must be fitted by a health care provider. The cap can be left in place for up to 48 hours after intercourse.  Sponge. This is a soft, circular piece of polyurethane foam. The sponge has spermicide in it. It is inserted into the vagina after wetting it and before sexual intercourse.  Spermicides. These are chemicals that kill or block sperm from entering the cervix and uterus. They come in the form of creams, jellies, suppositories, foam, or tablets. They do not require a prescription. They are inserted into the vagina with an applicator before having sexual intercourse. The process must be repeated every time you have sexual intercourse. INTRAUTERINE CONTRACEPTION  Intrauterine device (IUD). This is a T-shaped device that is put in a woman's uterus during a menstrual period to prevent pregnancy. There are 2 types:  Copper IUD. This type of IUD is wrapped in copper wire and is placed inside the uterus. Copper makes the uterus and fallopian tubes produce a fluid that kills sperm. It can stay in place for 10 years.  Hormone IUD. This type of IUD contains the hormone progestin (synthetic progesterone). The hormone thickens the cervical mucus and prevents sperm from entering the uterus, and it also thins the uterine lining to prevent implantation of a fertilized egg. The hormone can weaken or kill the sperm that get into the uterus. It can stay in place for 3-5 years, depending on which type of IUD is used. PERMANENT METHODS OF CONTRACEPTION  Female tubal ligation. This is when the woman's fallopian tubes are surgically sealed, tied, or blocked to prevent the egg from traveling to the uterus.  Hysteroscopic sterilization. This involves placing a small coil or insert into  each fallopian tube. Your doctor uses a technique called hysteroscopy to do the procedure. The device causes scar tissue to form. This results in permanent blockage of the fallopian tubes, so the sperm cannot fertilize the egg. It takes about 3 months after the procedure for the tubes to become blocked. You must use another form of birth control for these 3 months.  Female sterilization. This is when the female has the tubes that carry sperm tied off (vasectomy).This blocks sperm from entering the vagina during sexual intercourse. After the procedure, the man can still ejaculate fluid (semen). NATURAL PLANNING METHODS  Natural family planning. This is not having sexual intercourse or using a barrier method (condom, diaphragm, cervical cap) on days the woman could become pregnant.  Calendar method. This is keeping track of the length of each menstrual cycle and identifying when you are fertile.  Ovulation method. This is avoiding sexual intercourse during ovulation.  Symptothermal method. This is avoiding sexual intercourse during ovulation, using a thermometer and ovulation symptoms.  Post-ovulation method. This is timing sexual intercourse after you have ovulated.  Regardless of which type or method of contraception you choose, it is important that you use condoms to protect against the transmission of sexually transmitted infections (STIs). Talk with your health care provider about which form of contraception is most appropriate for you.   This information is not intended to replace advice given to you by your health care provider. Make sure you discuss any questions you have with your health care provider.   Document Released: 03/07/2005 Document Revised: 03/12/2013 Document Reviewed: 08/30/2012 Elsevier Interactive Patient Education [employer]

## 2015-09-07 NOTE — Progress Notes (Signed)
1 hour gtt given lab draw at

## 2015-09-07 NOTE — Progress Notes (Signed)
Subjective:  Shelly Hodges is a 40 y.o. G2P1001 at 1375w4d being seen today for ongoing prenatal care.  She is currently monitored for the following issues for this high-risk pregnancy and has Antepartum multigravida of advanced maternal age; Supervision of high risk pregnancy, antepartum; and Marijuana use on her problem list.  Patient reports pt worried about weight gain.  She doesn't know how much she weighed when she was pregnant.  May 120?  She has gained 3 pounds in 3.5 weeks which is reassuring.  Will wtch closely..  Contractions: Not present.  .  Movement: Present. Denies leaking of fluid.   The following portions of the patient's history were reviewed and updated as appropriate: allergies, current medications, past family history, past medical history, past social history, past surgical history and problem list. Problem list updated.  Objective:   Filed Vitals:   09/07/15 1109  BP: 126/66  Pulse: 72  Weight: 134 lb 3.2 oz (60.873 kg)    Fetal Status: Fetal Heart Rate (bpm): 154 Fundal Height: 26 cm Movement: Present     General:  Alert, oriented and cooperative. Patient is in no acute distress.  Skin: Skin is warm and dry. No rash noted.   Cardiovascular: Normal heart rate noted  Respiratory: Normal respiratory effort, no problems with respiration noted  Abdomen: Soft, gravid, appropriate for gestational age. Pain/Pressure: Absent     Pelvic: Cervical exam deferred        Extremities: Normal range of motion.  Edema: None  Mental Status: Normal mood and affect. Normal behavior. Normal judgment and thought content.   Urinalysis: Urine Protein: Negative Urine Glucose: Negative  Assessment and Plan:  Pregnancy: G2P1001 at 375w4d  1. Supervision of high risk pregnancy, antepartum, second trimester -tdap - CBC - RPR - HIV antibody - Glucose Tolerance, 1 HR (50g)  2.  Will watch weight gain  Preterm labor symptoms and general obstetric precautions including but not  limited to vaginal bleeding, contractions, leaking of fluid and fetal movement were reviewed in detail with the patient. Please refer to After Visit Summary for other counseling recommendations.  Return in about 2 days (around 09/09/2015).   Lesly DukesKelly H Pamalee Marcoe, MD

## 2015-09-08 ENCOUNTER — Encounter: Payer: Self-pay | Admitting: Obstetrics & Gynecology

## 2015-09-08 DIAGNOSIS — D649 Anemia, unspecified: Secondary | ICD-10-CM | POA: Insufficient documentation

## 2015-09-08 LAB — RPR

## 2015-09-09 ENCOUNTER — Telehealth: Payer: Self-pay

## 2015-09-09 NOTE — Telephone Encounter (Signed)
Per Dr. Penne LashLeggett, pt needs to start Fe for anemia and docusate sodium PRN.  Pt can purchase over the counter. Attempted to contact pt unable to LM due to phone keeps ringing without VM.

## 2015-09-15 NOTE — Telephone Encounter (Signed)
Called pt Shelly Hodges that this is our second attempt in trying to reach her please give the office a call and a letter will be sent.  Letter sent.

## 2015-09-15 NOTE — Progress Notes (Signed)
Pt returned call and I informed d/t anemia provider has recommended that she starts taking iron otc and docusate sodium PRN because it causes constipation.  Pt stated understanding with no further questions.  Did not send letter.

## 2015-09-23 ENCOUNTER — Encounter: Payer: Self-pay | Admitting: Advanced Practice Midwife

## 2015-09-23 ENCOUNTER — Ambulatory Visit (INDEPENDENT_AMBULATORY_CARE_PROVIDER_SITE_OTHER): Payer: Medicaid Other | Admitting: Advanced Practice Midwife

## 2015-09-23 VITALS — BP 102/68 | HR 75 | Wt 133.5 lb

## 2015-09-23 DIAGNOSIS — D649 Anemia, unspecified: Secondary | ICD-10-CM

## 2015-09-23 DIAGNOSIS — O0992 Supervision of high risk pregnancy, unspecified, second trimester: Secondary | ICD-10-CM

## 2015-09-23 DIAGNOSIS — D582 Other hemoglobinopathies: Secondary | ICD-10-CM | POA: Insufficient documentation

## 2015-09-23 DIAGNOSIS — O99013 Anemia complicating pregnancy, third trimester: Secondary | ICD-10-CM

## 2015-09-23 LAB — POCT URINALYSIS DIP (DEVICE)
BILIRUBIN URINE: NEGATIVE
Glucose, UA: NEGATIVE mg/dL
HGB URINE DIPSTICK: NEGATIVE
Ketones, ur: NEGATIVE mg/dL
NITRITE: NEGATIVE
PROTEIN: 30 mg/dL — AB
SPECIFIC GRAVITY, URINE: 1.025 (ref 1.005–1.030)
UROBILINOGEN UA: 1 mg/dL (ref 0.0–1.0)
pH: 6 (ref 5.0–8.0)

## 2015-09-23 NOTE — Progress Notes (Signed)
Subjective:  Shelly Hodges is a 40 y.o. G2P1001 at 558w6d being seen today for ongoing prenatal care.  Patient reports occasional episodes of shortness of breath, no wheezing, no longer smokes MJ.  Contractions: Not present.  Vag. Bleeding: None. Movement: Present. Denies leaking of fluid.   The following portions of the patient's history were reviewed and updated as appropriate: allergies, current medications, past family history, past medical history, past social history, past surgical history and problem list.   Objective:   Filed Vitals:   09/23/15 0842  BP: 102/68  Pulse: 75  Weight: 133 lb 8 oz (60.555 kg)    Fetal Status: Fetal Heart Rate (bpm): 142 Fundal Height: 31 cm Movement: Present     General:  Alert, oriented and cooperative. Patient is in no acute distress.  Skin: Skin is warm and dry. No rash noted.   Cardiovascular: Normal heart rate noted  Respiratory: Normal respiratory effort, no problems with respiration noted  Abdomen: Soft, gravid, appropriate for gestational age. Pain/Pressure: Absent     Vaginal: Vag. Bleeding: None.    Vag D/C Character: White  Cervix: Not evaluated        Extremities: Normal range of motion.  Edema: None  Mental Status: Normal mood and affect. Normal behavior. Normal judgment and thought content.   Urinalysis: Urine Protein: 1+ Urine Glucose: Negative  Assessment and Plan:  Pregnancy: G2P1001 at 578w6d  1. Supervision of high risk pregnancy, antepartum, second trimester     Doing well     Discussed normal glucola result     Discussed SOB may be normal changes of pregnancy, Lungs clear bilaterally. Taught her how to do deep breathing, encouraged 3x/day, warned to come in if wheezing or unresolved  2. Abnormal hemoglobin (HCC)     A2 variant, ? significance  3. Anemia, unspecified anemia type     Taking iron      Latest Hgb 9.9      Was 14  In past  Preterm labor symptoms and general obstetric precautions including but not  limited to vaginal bleeding, contractions, leaking of fluid and fetal movement were reviewed in detail with the patient. Please refer to After Visit Summary for other counseling recommendations.  Return in about 2 weeks (around 10/07/2015) for [facility].   Aviva SignsMarie L Marvin Grabill, CNM

## 2015-09-23 NOTE — Progress Notes (Signed)
C/o some pain in left wrist and numbness in right hand.

## 2015-09-23 NOTE — Patient Instructions (Signed)

## 2015-09-29 ENCOUNTER — Encounter: Payer: Self-pay | Admitting: *Deleted

## 2015-10-12 ENCOUNTER — Ambulatory Visit (INDEPENDENT_AMBULATORY_CARE_PROVIDER_SITE_OTHER): Payer: Medicaid Other | Admitting: Advanced Practice Midwife

## 2015-10-12 VITALS — BP 122/69 | HR 72 | Wt 134.0 lb

## 2015-10-12 DIAGNOSIS — O2613 Low weight gain in pregnancy, third trimester: Secondary | ICD-10-CM | POA: Diagnosis not present

## 2015-10-12 DIAGNOSIS — D649 Anemia, unspecified: Secondary | ICD-10-CM | POA: Diagnosis not present

## 2015-10-12 DIAGNOSIS — Z1389 Encounter for screening for other disorder: Secondary | ICD-10-CM

## 2015-10-12 DIAGNOSIS — O0993 Supervision of high risk pregnancy, unspecified, third trimester: Secondary | ICD-10-CM

## 2015-10-12 DIAGNOSIS — O99013 Anemia complicating pregnancy, third trimester: Secondary | ICD-10-CM | POA: Diagnosis not present

## 2015-10-12 DIAGNOSIS — O09523 Supervision of elderly multigravida, third trimester: Secondary | ICD-10-CM | POA: Diagnosis present

## 2015-10-12 DIAGNOSIS — O2610 Low weight gain in pregnancy, unspecified trimester: Secondary | ICD-10-CM | POA: Insufficient documentation

## 2015-10-12 LAB — POCT URINALYSIS DIP (DEVICE)
GLUCOSE, UA: NEGATIVE mg/dL
Hgb urine dipstick: NEGATIVE
KETONES UR: 40 mg/dL — AB
LEUKOCYTES UA: NEGATIVE
Nitrite: NEGATIVE
Protein, ur: 30 mg/dL — AB
Urobilinogen, UA: 1 mg/dL (ref 0.0–1.0)
pH: 6.5 (ref 5.0–8.0)

## 2015-10-12 NOTE — Progress Notes (Signed)
Patient ID: Shelly Hodges, female   DOB: 1975/07/26, 40 y.o.   MRN: [id number] Subjective:  Shelly Hodges is a 40 y.o. G2P1001 at [redacted]w[redacted]d being seen today for ongoing prenatal care.  She is currently monitored for the following issues for this high-risk pregnancy and has Antepartum multigravida of advanced maternal age; Supervision of high risk pregnancy, antepartum; Marijuana use; Anemia; and Abnormal hemoglobin (HCC) on her problem list.  Patient reports no complaints.  Contractions: Not present. Vag. Bleeding: None.  Movement: Present. Denies leaking of fluid.   The following portions of the patient's history were reviewed and updated as appropriate: allergies, current medications, past family history, past medical history, past social history, past surgical history and problem list. Problem list updated.  Objective:   Vitals:   10/12/15 1316  BP: 122/69  Pulse: 72  Weight: 134 lb (60.8 kg)    Fetal Status: Fetal Heart Rate (bpm): 134   Movement: Present     General:  Alert, oriented and cooperative. Patient is in no acute distress.  Skin: Skin is warm and dry. No rash noted.   Cardiovascular: Normal heart rate noted  Respiratory: Normal respiratory effort, no problems with respiration noted  Abdomen: Soft, gravid, appropriate for gestational age. Pain/Pressure: Absent     Pelvic:  Cervical exam deferred        Extremities: Normal range of motion.  Edema: None  Mental Status: Normal mood and affect. Normal behavior. Normal judgment and thought content.   Urinalysis:      Assessment and Plan:  Pregnancy: G2P1001 at [redacted]w[redacted]d  1. Antepartum multigravida of advanced maternal age, third trimester  - [place] [facility] FOLLOW UP; Future  2. Supervision of high risk pregnancy, antepartum, third trimester  - [place] [facility] FOLLOW UP; Future  3. Anemia, unspecified anemia type  - [place] [facility] FOLLOW [facility]; Future - Taking Iron  4. Poor weight gain of pregnancy, third trimester  - [place] [facility]  FOLLOW UP; Future  5. Encounter for routine screening for malformation using ultrasonics  - [place] [facility] FOLLOW UP; Future   Preterm labor symptoms and general obstetric precautions including but not limited to vaginal bleeding, contractions, leaking of fluid and fetal movement were reviewed in detail with the patient. Please refer to After Visit Summary for other counseling recommendations.  F/U 2 weeks   Dorathy Kinsman, [place]

## 2015-10-12 NOTE — Patient Instructions (Signed)
Braxton Hicks Contractions °Contractions of the uterus can occur throughout pregnancy. Contractions are not always a sign that you are in labor.  °WHAT ARE BRAXTON HICKS CONTRACTIONS?  °Contractions that occur before labor are called Braxton Hicks contractions, or false labor. Toward the end of pregnancy (32-34 weeks), these contractions can develop more often and may become more forceful. This is not true labor because these contractions do not result in opening (dilatation) and thinning of the cervix. They are sometimes difficult to tell apart from true labor because these contractions can be forceful and people have different pain tolerances. You should not feel embarrassed if you go to the hospital with false labor. Sometimes, the only way to tell if you are in true labor is for your health care provider to look for changes in the cervix. °If there are no prenatal problems or other health problems associated with the pregnancy, it is completely safe to be sent home with false labor and await the onset of true labor. °HOW CAN YOU TELL THE DIFFERENCE BETWEEN TRUE AND FALSE LABOR? °False Labor °· The contractions of false labor are usually shorter and not as hard as those of true labor.   °· The contractions are usually irregular.   °· The contractions are often felt in the front of the lower abdomen and in the groin.   °· The contractions may go away when you walk around or change positions while lying down.   °· The contractions get weaker and are shorter lasting as time goes on.   °· The contractions do not usually become progressively stronger, regular, and closer together as with true labor.   °True Labor °· Contractions in true labor last 30-70 seconds, become very regular, usually become more intense, and increase in frequency.   °· The contractions do not go away with walking.   °· The discomfort is usually felt in the top of the uterus and spreads to the lower abdomen and low back.   °· True labor can be  determined by your health care provider with an exam. This will show that the cervix is dilating and getting thinner.   °WHAT TO REMEMBER °· Keep up with your usual exercises and follow other instructions given by your health care provider.   °· Take medicines as directed by your health care provider.   °· Keep your regular prenatal appointments.   °· Eat and drink lightly if you think you are going into labor.   °· If Braxton Hicks contractions are making you uncomfortable:   °¨ Change your position from lying down or resting to walking, or from walking to resting.   °¨ Sit and rest in a tub of warm water.   °¨ Drink 2-3 glasses of water. Dehydration may cause these contractions.   °¨ Do slow and deep breathing several times an hour.   °WHEN SHOULD I SEEK IMMEDIATE MEDICAL CARE? °Seek immediate medical care if: °· Your contractions become stronger, more regular, and closer together.   °· You have fluid leaking or gushing from your vagina.   °· You have a fever.   °· You pass blood-tinged mucus.   °· You have vaginal bleeding.   °· You have continuous abdominal pain.   °· You have low back pain that you never had before.   °· You feel your baby's head pushing down and causing pelvic pressure.   °· Your baby is not moving as much as it used to.   °  °This information is not intended to replace advice given to you by your health care provider. Make sure you discuss any questions you have with your health care   provider. °  °Document Released: 03/07/2005 Document Revised: 03/12/2013 Document Reviewed: 12/17/2012 °Elsevier Interactive Patient Education ©2016 [employer] ° °

## 2015-10-27 ENCOUNTER — Ambulatory Visit (HOSPITAL_COMMUNITY)
Admission: RE | Admit: 2015-10-27 | Discharge: 2015-10-27 | Disposition: A | Discharge status: Home / Self Care | Payer: Medicaid Other | Source: Ambulatory Visit | Attending: Advanced Practice Midwife | Admitting: Advanced Practice Midwife

## 2015-10-27 ENCOUNTER — Encounter (HOSPITAL_COMMUNITY): Payer: Self-pay

## 2015-10-27 DIAGNOSIS — O09523 Supervision of elderly multigravida, third trimester: Secondary | ICD-10-CM | POA: Insufficient documentation

## 2015-10-27 DIAGNOSIS — O0993 Supervision of high risk pregnancy, unspecified, third trimester: Secondary | ICD-10-CM

## 2015-10-27 DIAGNOSIS — Z1389 Encounter for screening for other disorder: Secondary | ICD-10-CM

## 2015-10-27 DIAGNOSIS — Z3A34 34 weeks gestation of pregnancy: Secondary | ICD-10-CM | POA: Insufficient documentation

## 2015-10-27 DIAGNOSIS — O2613 Low weight gain in pregnancy, third trimester: Secondary | ICD-10-CM

## 2015-10-27 DIAGNOSIS — Z36 Encounter for antenatal screening of mother: Secondary | ICD-10-CM | POA: Diagnosis not present

## 2015-10-27 DIAGNOSIS — D649 Anemia, unspecified: Secondary | ICD-10-CM | POA: Insufficient documentation

## 2015-11-03 ENCOUNTER — Ambulatory Visit (INDEPENDENT_AMBULATORY_CARE_PROVIDER_SITE_OTHER): Payer: Medicaid Other | Admitting: Medical

## 2015-11-03 ENCOUNTER — Other Ambulatory Visit (HOSPITAL_COMMUNITY)
Admission: RE | Admit: 2015-11-03 | Discharge: 2015-11-03 | Disposition: A | Discharge status: Home / Self Care | Payer: Medicaid Other | Source: Ambulatory Visit | Attending: Medical | Admitting: Medical

## 2015-11-03 VITALS — BP 132/68 | HR 91 | Wt 134.6 lb

## 2015-11-03 DIAGNOSIS — O09523 Supervision of elderly multigravida, third trimester: Secondary | ICD-10-CM | POA: Diagnosis present

## 2015-11-03 DIAGNOSIS — Z113 Encounter for screening for infections with a predominantly sexual mode of transmission: Secondary | ICD-10-CM | POA: Insufficient documentation

## 2015-11-03 DIAGNOSIS — O0993 Supervision of high risk pregnancy, unspecified, third trimester: Secondary | ICD-10-CM

## 2015-11-03 LAB — POCT URINALYSIS DIP (DEVICE)
BILIRUBIN URINE: NEGATIVE
Glucose, UA: NEGATIVE mg/dL
HGB URINE DIPSTICK: NEGATIVE
Ketones, ur: NEGATIVE mg/dL
Nitrite: NEGATIVE
Protein, ur: NEGATIVE mg/dL
SPECIFIC GRAVITY, URINE: 1.015 (ref 1.005–1.030)
UROBILINOGEN UA: 0.2 mg/dL (ref 0.0–1.0)
pH: 6 (ref 5.0–8.0)

## 2015-11-03 LAB — OB RESULTS CONSOLE GBS: GBS: POSITIVE

## 2015-11-03 LAB — OB RESULTS CONSOLE GC/CHLAMYDIA: GC PROBE AMP, GENITAL: NEGATIVE

## 2015-11-03 NOTE — Progress Notes (Signed)
Subjective:  Shelly Hodges is a 40 y.o. G2P1001 at 4456w5d being seen today for ongoing prenatal care.  She is currently monitored for the following issues for this high-risk pregnancy and has Antepartum multigravida of advanced maternal age; Supervision of high risk pregnancy, antepartum; Marijuana use; Anemia; Abnormal hemoglobin (HCC); and Poor weight gain of pregnancy on her problem list.  Patient reports no complaints.  Contractions: Not present. Vag. Bleeding: None.  Movement: Present. Denies leaking of fluid.   The following portions of the patient's history were reviewed and updated as appropriate: allergies, current medications, past family history, past medical history, past social history, past surgical history and problem list. Problem list updated.  Objective:   Vitals:   11/03/15 0808  BP: 132/68  Pulse: 91  Weight: 134 lb 9.6 oz (61.1 kg)    Fetal Status: Fetal Heart Rate (bpm): 148 Fundal Height: 36 cm Movement: Present  Presentation: Vertex  General:  Alert, oriented and cooperative. Patient is in no acute distress.  Skin: Skin is warm and dry. No rash noted.   Cardiovascular: Normal heart rate noted  Respiratory: Normal respiratory effort, no problems with respiration noted  Abdomen: Soft, gravid, appropriate for gestational age. Pain/Pressure: Absent     Pelvic:  Cervical exam performed Dilation: Fingertip Effacement (%): 0 Station: -3  Extremities: Normal range of motion.  Edema: None  Mental Status: Normal mood and affect. Normal behavior. Normal judgment and thought content.   Urinalysis: Urine Protein: Negative Urine Glucose: Negative  Assessment and Plan:  Pregnancy: G2P1001 at 6956w5d  1. Pregnancy, supervision, high-risk, third trimester - Culture, beta strep (group b only) - GC/Chlamydia probe amp ([place])not at [facility]  Preterm labor symptoms and general obstetric precautions including but not limited to vaginal bleeding, contractions, leaking of  fluid and fetal movement were reviewed in detail with the patient. Please refer to After Visit Summary for other counseling recommendations.  Return in about 1 week (around 11/10/2015) for HROB.   Marny LowensteinJulie N Maritsa Hunsucker, PA-C

## 2015-11-03 NOTE — Patient Instructions (Signed)
Fetal Movement Counts °Patient Name: __________________________________________________ Patient Due Date: ____________________ °Performing a fetal movement count is highly recommended in high-risk pregnancies, but it is good for every pregnant woman to do. Your health care provider may ask you to start counting fetal movements at 28 weeks of the pregnancy. Fetal movements often increase: °· After eating a full meal. °· After physical activity. °· After eating or drinking something sweet or cold. °· At rest. °Pay attention to when you feel the baby is most active. This will help you notice a pattern of your baby's sleep and wake cycles and what factors contribute to an increase in fetal movement. It is important to perform a fetal movement count at the same time each day when your baby is normally most active.  °HOW TO COUNT FETAL MOVEMENTS °1. Find a quiet and comfortable area to sit or lie down on your left side. Lying on your left side provides the best blood and oxygen circulation to your baby. °2. Write down the day and time on a sheet of paper or in a journal. °3. Start counting kicks, flutters, swishes, rolls, or jabs in a 2-hour period. You should feel at least 10 movements within 2 hours. °4. If you do not feel 10 movements in 2 hours, wait 2-3 hours and count again. Look for a change in the pattern or not enough counts in 2 hours. °SEEK MEDICAL CARE IF: °· You feel less than 10 counts in 2 hours, tried twice. °· There is no movement in over an hour. °· The pattern is changing or taking longer each day to reach 10 counts in 2 hours. °· You feel the baby is not moving as he or she usually does. °Date: ____________ Movements: ____________ Start time: ____________ Finish time: ____________  °Date: ____________ Movements: ____________ Start time: ____________ Finish time: ____________ °Date: ____________ Movements: ____________ Start time: ____________ Finish time: ____________ °Date: ____________ Movements:  ____________ Start time: ____________ Finish time: ____________ °Date: ____________ Movements: ____________ Start time: ____________ Finish time: ____________ °Date: ____________ Movements: ____________ Start time: ____________ Finish time: ____________ °Date: ____________ Movements: ____________ Start time: ____________ Finish time: ____________ °Date: ____________ Movements: ____________ Start time: ____________ Finish time: ____________  °Date: ____________ Movements: ____________ Start time: ____________ Finish time: ____________ °Date: ____________ Movements: ____________ Start time: ____________ Finish time: ____________ °Date: ____________ Movements: ____________ Start time: ____________ Finish time: ____________ °Date: ____________ Movements: ____________ Start time: ____________ Finish time: ____________ °Date: ____________ Movements: ____________ Start time: ____________ Finish time: ____________ °Date: ____________ Movements: ____________ Start time: ____________ Finish time: ____________ °Date: ____________ Movements: ____________ Start time: ____________ Finish time: ____________  °Date: ____________ Movements: ____________ Start time: ____________ Finish time: ____________ °Date: ____________ Movements: ____________ Start time: ____________ Finish time: ____________ °Date: ____________ Movements: ____________ Start time: ____________ Finish time: ____________ °Date: ____________ Movements: ____________ Start time: ____________ Finish time: ____________ °Date: ____________ Movements: ____________ Start time: ____________ Finish time: ____________ °Date: ____________ Movements: ____________ Start time: ____________ Finish time: ____________ °Date: ____________ Movements: ____________ Start time: ____________ Finish time: ____________  °Date: ____________ Movements: ____________ Start time: ____________ Finish time: ____________ °Date: ____________ Movements: ____________ Start time: ____________ Finish  time: ____________ °Date: ____________ Movements: ____________ Start time: ____________ Finish time: ____________ °Date: ____________ Movements: ____________ Start time: ____________ Finish time: ____________ °Date: ____________ Movements: ____________ Start time: ____________ Finish time: ____________ °Date: ____________ Movements: ____________ Start time: ____________ Finish time: ____________ °Date: ____________ Movements: ____________ Start time: ____________ Finish time: ____________  °Date: ____________ Movements: ____________ Start time: ____________ Finish   time: ____________ °Date: ____________ Movements: ____________ Start time: ____________ Finish time: ____________ °Date: ____________ Movements: ____________ Start time: ____________ Finish time: ____________ °Date: ____________ Movements: ____________ Start time: ____________ Finish time: ____________ °Date: ____________ Movements: ____________ Start time: ____________ Finish time: ____________ °Date: ____________ Movements: ____________ Start time: ____________ Finish time: ____________ °Date: ____________ Movements: ____________ Start time: ____________ Finish time: ____________  °Date: ____________ Movements: ____________ Start time: ____________ Finish time: ____________ °Date: ____________ Movements: ____________ Start time: ____________ Finish time: ____________ °Date: ____________ Movements: ____________ Start time: ____________ Finish time: ____________ °Date: ____________ Movements: ____________ Start time: ____________ Finish time: ____________ °Date: ____________ Movements: ____________ Start time: ____________ Finish time: ____________ °Date: ____________ Movements: ____________ Start time: ____________ Finish time: ____________ °Date: ____________ Movements: ____________ Start time: ____________ Finish time: ____________  °Date: ____________ Movements: ____________ Start time: ____________ Finish time: ____________ °Date: ____________  Movements: ____________ Start time: ____________ Finish time: ____________ °Date: ____________ Movements: ____________ Start time: ____________ Finish time: ____________ °Date: ____________ Movements: ____________ Start time: ____________ Finish time: ____________ °Date: ____________ Movements: ____________ Start time: ____________ Finish time: ____________ °Date: ____________ Movements: ____________ Start time: ____________ Finish time: ____________ °Date: ____________ Movements: ____________ Start time: ____________ Finish time: ____________  °Date: ____________ Movements: ____________ Start time: ____________ Finish time: ____________ °Date: ____________ Movements: ____________ Start time: ____________ Finish time: ____________ °Date: ____________ Movements: ____________ Start time: ____________ Finish time: ____________ °Date: ____________ Movements: ____________ Start time: ____________ Finish time: ____________ °Date: ____________ Movements: ____________ Start time: ____________ Finish time: ____________ °Date: ____________ Movements: ____________ Start time: ____________ Finish time: ____________ °  °This information is not intended to replace advice given to you by your health care provider. Make sure you discuss any questions you have with your health care provider. °  °Document Released: 04/06/2006 Document Revised: 03/28/2014 Document Reviewed: 01/02/2012 °Elsevier Interactive Patient Education ©2016 [employer] °Braxton Hicks Contractions °Contractions of the uterus can occur throughout pregnancy. Contractions are not always a sign that you are in labor.  °WHAT ARE BRAXTON HICKS CONTRACTIONS?  °Contractions that occur before labor are called Braxton Hicks contractions, or false labor. Toward the end of pregnancy (32-34 weeks), these contractions can develop more often and may become more forceful. This is not true labor because these contractions do not result in opening (dilatation) and thinning of  the cervix. They are sometimes difficult to tell apart from true labor because these contractions can be forceful and people have different pain tolerances. You should not feel embarrassed if you go to the hospital with false labor. Sometimes, the only way to tell if you are in true labor is for your health care provider to look for changes in the cervix. °If there are no prenatal problems or other health problems associated with the pregnancy, it is completely safe to be sent home with false labor and await the onset of true labor. °HOW CAN YOU TELL THE DIFFERENCE BETWEEN TRUE AND FALSE LABOR? °False Labor °· The contractions of false labor are usually shorter and not as hard as those of true labor.   °· The contractions are usually irregular.   °· The contractions are often felt in the front of the lower abdomen and in the groin.   °· The contractions may go away when you walk around or change positions while lying down.   °· The contractions get weaker and are shorter lasting as time goes on.   °· The contractions do not usually become progressively stronger, regular, and closer together as with true labor.   °True Labor °· Contractions in true   labor last 30-70 seconds, become very regular, usually become more intense, and increase in frequency.   °· The contractions do not go away with walking.   °· The discomfort is usually felt in the top of the uterus and spreads to the lower abdomen and low back.   °· True labor can be determined by your health care provider with an exam. This will show that the cervix is dilating and getting thinner.   °WHAT TO REMEMBER °· Keep up with your usual exercises and follow other instructions given by your health care provider.   °· Take medicines as directed by your health care provider.   °· Keep your regular prenatal appointments.   °· Eat and drink lightly if you think you are going into labor.   °· If Braxton Hicks contractions are making you uncomfortable:   °¨ Change your  position from lying down or resting to walking, or from walking to resting.   °¨ Sit and rest in a tub of warm water.   °¨ Drink 2-3 glasses of water. Dehydration may cause these contractions.   °¨ Do slow and deep breathing several times an hour.   °WHEN SHOULD I SEEK IMMEDIATE MEDICAL CARE? °Seek immediate medical care if: °· Your contractions become stronger, more regular, and closer together.   °· You have fluid leaking or gushing from your vagina.   °· You have a fever.   °· You pass blood-tinged mucus.   °· You have vaginal bleeding.   °· You have continuous abdominal pain.   °· You have low back pain that you never had before.   °· You feel your baby's head pushing down and causing pelvic pressure.   °· Your baby is not moving as much as it used to.   °  °This information is not intended to replace advice given to you by your health care provider. Make sure you discuss any questions you have with your health care provider. °  °Document Released: 03/07/2005 Document Revised: 03/12/2013 Document Reviewed: 12/17/2012 °Elsevier Interactive Patient Education ©2016 [employer] Inc[employer] ° °

## 2015-11-04 LAB — GC/CHLAMYDIA PROBE AMP (~~LOC~~) NOT AT ARMC
CHLAMYDIA, DNA PROBE: NEGATIVE
NEISSERIA GONORRHEA: NEGATIVE

## 2015-11-05 LAB — CULTURE, BETA STREP (GROUP B ONLY)

## 2015-11-10 ENCOUNTER — Ambulatory Visit (INDEPENDENT_AMBULATORY_CARE_PROVIDER_SITE_OTHER): Payer: Medicaid Other | Admitting: Medical

## 2015-11-10 VITALS — BP 117/63 | HR 84 | Wt 134.6 lb

## 2015-11-10 DIAGNOSIS — O0993 Supervision of high risk pregnancy, unspecified, third trimester: Secondary | ICD-10-CM

## 2015-11-10 LAB — POCT URINALYSIS DIP (DEVICE)
Bilirubin Urine: NEGATIVE
GLUCOSE, UA: NEGATIVE mg/dL
Hgb urine dipstick: NEGATIVE
KETONES UR: NEGATIVE mg/dL
NITRITE: NEGATIVE
PROTEIN: 30 mg/dL — AB
Specific Gravity, Urine: 1.015 (ref 1.005–1.030)
Urobilinogen, UA: 1 mg/dL (ref 0.0–1.0)
pH: 7 (ref 5.0–8.0)

## 2015-11-10 NOTE — Patient Instructions (Signed)
Fetal Movement Counts °Patient Name: __________________________________________________ Patient Due Date: ____________________ °Performing a fetal movement count is highly recommended in high-risk pregnancies, but it is good for every pregnant woman to do. Your health care provider may ask you to start counting fetal movements at 28 weeks of the pregnancy. Fetal movements often increase: °· After eating a full meal. °· After physical activity. °· After eating or drinking something sweet or cold. °· At rest. °Pay attention to when you feel the baby is most active. This will help you notice a pattern of your baby's sleep and wake cycles and what factors contribute to an increase in fetal movement. It is important to perform a fetal movement count at the same time each day when your baby is normally most active.  °HOW TO COUNT FETAL MOVEMENTS °1. Find a quiet and comfortable area to sit or lie down on your left side. Lying on your left side provides the best blood and oxygen circulation to your baby. °2. Write down the day and time on a sheet of paper or in a journal. °3. Start counting kicks, flutters, swishes, rolls, or jabs in a 2-hour period. You should feel at least 10 movements within 2 hours. °4. If you do not feel 10 movements in 2 hours, wait 2-3 hours and count again. Look for a change in the pattern or not enough counts in 2 hours. °SEEK MEDICAL CARE IF: °· You feel less than 10 counts in 2 hours, tried twice. °· There is no movement in over an hour. °· The pattern is changing or taking longer each day to reach 10 counts in 2 hours. °· You feel the baby is not moving as he or she usually does. °Date: ____________ Movements: ____________ Start time: ____________ Finish time: ____________  °Date: ____________ Movements: ____________ Start time: ____________ Finish time: ____________ °Date: ____________ Movements: ____________ Start time: ____________ Finish time: ____________ °Date: ____________ Movements:  ____________ Start time: ____________ Finish time: ____________ °Date: ____________ Movements: ____________ Start time: ____________ Finish time: ____________ °Date: ____________ Movements: ____________ Start time: ____________ Finish time: ____________ °Date: ____________ Movements: ____________ Start time: ____________ Finish time: ____________ °Date: ____________ Movements: ____________ Start time: ____________ Finish time: ____________  °Date: ____________ Movements: ____________ Start time: ____________ Finish time: ____________ °Date: ____________ Movements: ____________ Start time: ____________ Finish time: ____________ °Date: ____________ Movements: ____________ Start time: ____________ Finish time: ____________ °Date: ____________ Movements: ____________ Start time: ____________ Finish time: ____________ °Date: ____________ Movements: ____________ Start time: ____________ Finish time: ____________ °Date: ____________ Movements: ____________ Start time: ____________ Finish time: ____________ °Date: ____________ Movements: ____________ Start time: ____________ Finish time: ____________  °Date: ____________ Movements: ____________ Start time: ____________ Finish time: ____________ °Date: ____________ Movements: ____________ Start time: ____________ Finish time: ____________ °Date: ____________ Movements: ____________ Start time: ____________ Finish time: ____________ °Date: ____________ Movements: ____________ Start time: ____________ Finish time: ____________ °Date: ____________ Movements: ____________ Start time: ____________ Finish time: ____________ °Date: ____________ Movements: ____________ Start time: ____________ Finish time: ____________ °Date: ____________ Movements: ____________ Start time: ____________ Finish time: ____________  °Date: ____________ Movements: ____________ Start time: ____________ Finish time: ____________ °Date: ____________ Movements: ____________ Start time: ____________ Finish  time: ____________ °Date: ____________ Movements: ____________ Start time: ____________ Finish time: ____________ °Date: ____________ Movements: ____________ Start time: ____________ Finish time: ____________ °Date: ____________ Movements: ____________ Start time: ____________ Finish time: ____________ °Date: ____________ Movements: ____________ Start time: ____________ Finish time: ____________ °Date: ____________ Movements: ____________ Start time: ____________ Finish time: ____________  °Date: ____________ Movements: ____________ Start time: ____________ Finish   time: ____________ °Date: ____________ Movements: ____________ Start time: ____________ Finish time: ____________ °Date: ____________ Movements: ____________ Start time: ____________ Finish time: ____________ °Date: ____________ Movements: ____________ Start time: ____________ Finish time: ____________ °Date: ____________ Movements: ____________ Start time: ____________ Finish time: ____________ °Date: ____________ Movements: ____________ Start time: ____________ Finish time: ____________ °Date: ____________ Movements: ____________ Start time: ____________ Finish time: ____________  °Date: ____________ Movements: ____________ Start time: ____________ Finish time: ____________ °Date: ____________ Movements: ____________ Start time: ____________ Finish time: ____________ °Date: ____________ Movements: ____________ Start time: ____________ Finish time: ____________ °Date: ____________ Movements: ____________ Start time: ____________ Finish time: ____________ °Date: ____________ Movements: ____________ Start time: ____________ Finish time: ____________ °Date: ____________ Movements: ____________ Start time: ____________ Finish time: ____________ °Date: ____________ Movements: ____________ Start time: ____________ Finish time: ____________  °Date: ____________ Movements: ____________ Start time: ____________ Finish time: ____________ °Date: ____________  Movements: ____________ Start time: ____________ Finish time: ____________ °Date: ____________ Movements: ____________ Start time: ____________ Finish time: ____________ °Date: ____________ Movements: ____________ Start time: ____________ Finish time: ____________ °Date: ____________ Movements: ____________ Start time: ____________ Finish time: ____________ °Date: ____________ Movements: ____________ Start time: ____________ Finish time: ____________ °Date: ____________ Movements: ____________ Start time: ____________ Finish time: ____________  °Date: ____________ Movements: ____________ Start time: ____________ Finish time: ____________ °Date: ____________ Movements: ____________ Start time: ____________ Finish time: ____________ °Date: ____________ Movements: ____________ Start time: ____________ Finish time: ____________ °Date: ____________ Movements: ____________ Start time: ____________ Finish time: ____________ °Date: ____________ Movements: ____________ Start time: ____________ Finish time: ____________ °Date: ____________ Movements: ____________ Start time: ____________ Finish time: ____________ °  °This information is not intended to replace advice given to you by your health care provider. Make sure you discuss any questions you have with your health care provider. °  °Document Released: 04/06/2006 Document Revised: 03/28/2014 Document Reviewed: 01/02/2012 °Elsevier Interactive Patient Education ©2016 [employer] °Braxton Hicks Contractions °Contractions of the uterus can occur throughout pregnancy. Contractions are not always a sign that you are in labor.  °WHAT ARE BRAXTON HICKS CONTRACTIONS?  °Contractions that occur before labor are called Braxton Hicks contractions, or false labor. Toward the end of pregnancy (32-34 weeks), these contractions can develop more often and may become more forceful. This is not true labor because these contractions do not result in opening (dilatation) and thinning of  the cervix. They are sometimes difficult to tell apart from true labor because these contractions can be forceful and people have different pain tolerances. You should not feel embarrassed if you go to the hospital with false labor. Sometimes, the only way to tell if you are in true labor is for your health care provider to look for changes in the cervix. °If there are no prenatal problems or other health problems associated with the pregnancy, it is completely safe to be sent home with false labor and await the onset of true labor. °HOW CAN YOU TELL THE DIFFERENCE BETWEEN TRUE AND FALSE LABOR? °False Labor °· The contractions of false labor are usually shorter and not as hard as those of true labor.   °· The contractions are usually irregular.   °· The contractions are often felt in the front of the lower abdomen and in the groin.   °· The contractions may go away when you walk around or change positions while lying down.   °· The contractions get weaker and are shorter lasting as time goes on.   °· The contractions do not usually become progressively stronger, regular, and closer together as with true labor.   °True Labor °· Contractions in true   labor last 30-70 seconds, become very regular, usually become more intense, and increase in frequency.   °· The contractions do not go away with walking.   °· The discomfort is usually felt in the top of the uterus and spreads to the lower abdomen and low back.   °· True labor can be determined by your health care provider with an exam. This will show that the cervix is dilating and getting thinner.   °WHAT TO REMEMBER °· Keep up with your usual exercises and follow other instructions given by your health care provider.   °· Take medicines as directed by your health care provider.   °· Keep your regular prenatal appointments.   °· Eat and drink lightly if you think you are going into labor.   °· If Braxton Hicks contractions are making you uncomfortable:   °¨ Change your  position from lying down or resting to walking, or from walking to resting.   °¨ Sit and rest in a tub of warm water.   °¨ Drink 2-3 glasses of water. Dehydration may cause these contractions.   °¨ Do slow and deep breathing several times an hour.   °WHEN SHOULD I SEEK IMMEDIATE MEDICAL CARE? °Seek immediate medical care if: °· Your contractions become stronger, more regular, and closer together.   °· You have fluid leaking or gushing from your vagina.   °· You have a fever.   °· You pass blood-tinged mucus.   °· You have vaginal bleeding.   °· You have continuous abdominal pain.   °· You have low back pain that you never had before.   °· You feel your baby's head pushing down and causing pelvic pressure.   °· Your baby is not moving as much as it used to.   °  °This information is not intended to replace advice given to you by your health care provider. Make sure you discuss any questions you have with your health care provider. °  °Document Released: 03/07/2005 Document Revised: 03/12/2013 Document Reviewed: 12/17/2012 °Elsevier Interactive Patient Education ©2016 [employer] Inc[employer] ° °

## 2015-11-10 NOTE — Progress Notes (Signed)
Subjective:  Shelly Hodges is a 40 y.o. G2P1001 at 3532w5d being seen today for ongoing prenatal care.  She is currently monitored for the following issues for this low-risk pregnancy and has Antepartum multigravida of advanced maternal age; Supervision of high risk pregnancy, antepartum; Marijuana use; Anemia; Abnormal hemoglobin (HCC); and Poor weight gain of pregnancy on her problem list.  Patient reports no complaints.  Contractions: Irritability. Vag. Bleeding: None.  Movement: Present. Denies leaking of fluid.   The following portions of the patient's history were reviewed and updated as appropriate: allergies, current medications, past family history, past medical history, past social history, past surgical history and problem list. Problem list updated.  Objective:   Vitals:   11/10/15 0810  BP: 117/63  Pulse: 84  Weight: 134 lb 9.6 oz (61.1 kg)    Fetal Status: Fetal Heart Rate (bpm): 143 Fundal Height: 36 cm Movement: Present     General:  Alert, oriented and cooperative. Patient is in no acute distress.  Skin: Skin is warm and dry. No rash noted.   Cardiovascular: Normal heart rate noted  Respiratory: Normal respiratory effort, no problems with respiration noted  Abdomen: Soft, gravid, appropriate for gestational age. Pain/Pressure: Absent     Pelvic:  Cervical exam deferred        Extremities: Normal range of motion.  Edema: None  Mental Status: Normal mood and affect. Normal behavior. Normal judgment and thought content.   Urinalysis: Urine Protein: 1+ Urine Glucose: Negative  Assessment and Plan:  Pregnancy: G2P1001 at 6032w5d  1. Pregnancy, supervision, high-risk, third trimester - +GBS from last visit discussed. Patient will need treatment in labor  Preterm labor symptoms and general obstetric precautions including but not limited to vaginal bleeding, contractions, leaking of fluid and fetal movement were reviewed in detail with the patient. Please refer to After  Visit Summary for other counseling recommendations.  Return in about 1 week (around 11/17/2015) for ROB.   Marny LowensteinJulie N Londen Bok, PA-C

## 2015-11-17 ENCOUNTER — Encounter: Payer: Medicaid Other | Admitting: Family

## 2015-11-25 ENCOUNTER — Ambulatory Visit (INDEPENDENT_AMBULATORY_CARE_PROVIDER_SITE_OTHER): Payer: Medicaid Other | Admitting: Family

## 2015-11-25 VITALS — BP 123/65 | HR 78 | Wt 140.7 lb

## 2015-11-25 DIAGNOSIS — O0993 Supervision of high risk pregnancy, unspecified, third trimester: Secondary | ICD-10-CM

## 2015-11-25 DIAGNOSIS — Z3493 Encounter for supervision of normal pregnancy, unspecified, third trimester: Secondary | ICD-10-CM

## 2015-11-25 LAB — POCT URINALYSIS DIP (DEVICE)
Bilirubin Urine: NEGATIVE
GLUCOSE, UA: NEGATIVE mg/dL
Hgb urine dipstick: NEGATIVE
KETONES UR: NEGATIVE mg/dL
Nitrite: NEGATIVE
Protein, ur: NEGATIVE mg/dL
SPECIFIC GRAVITY, URINE: 1.01 (ref 1.005–1.030)
UROBILINOGEN UA: 0.2 mg/dL (ref 0.0–1.0)
pH: 7 (ref 5.0–8.0)

## 2015-11-25 NOTE — Patient Instructions (Signed)

## 2015-11-25 NOTE — Progress Notes (Signed)
   PRENATAL VISIT NOTE  Subjective:  Shelly Hodges is a 40 y.o. G2P1001 at 3161w6d being seen today for ongoing prenatal care.  She is currently monitored for the following issues for this low-risk pregnancy and has Antepartum multigravida of advanced maternal age; Supervision of high risk pregnancy, antepartum; Marijuana use; Anemia; Abnormal hemoglobin (HCC); and Poor weight gain of pregnancy on her problem list.  Patient reports occasional contractions.  Contractions: Irritability. Vag. Bleeding: None.  Movement: Present. Denies leaking of fluid.   The following portions of the patient's history were reviewed and updated as appropriate: allergies, current medications, past family history, past medical history, past social history, past surgical history and problem list. Problem list updated.  Objective:   Vitals:   11/25/15 0753  BP: 123/65  Pulse: 78  Weight: 140 lb 11.2 oz (63.8 kg)    Fetal Status: Fetal Heart Rate (bpm): 135   Movement: Present     General:  Alert, oriented and cooperative. Patient is in no acute distress.  Skin: Skin is warm and dry. No rash noted.   Cardiovascular: Normal heart rate noted  Respiratory: Normal respiratory effort, no problems with respiration noted  Abdomen: Soft, gravid, appropriate for gestational age. Pain/Pressure: Absent     Pelvic:  Cervical exam deferred        Extremities: Normal range of motion.  Edema: Trace  Mental Status: Normal mood and affect. Normal behavior. Normal judgment and thought content.   Urinalysis: Urine Protein: Negative Urine Glucose: Negative  Assessment and Plan:  Pregnancy: G2P1001 at 7361w6d  1. Supervision of high risk pregnancy, antepartum, third trimester - Aware of +GBS status  Term labor symptoms and general obstetric precautions including but not limited to vaginal bleeding, contractions, leaking of fluid and fetal movement were reviewed in detail with the patient. Please refer to After Visit  Summary for other counseling recommendations.  Return in 1 week (on 12/02/2015).  Eino FarberWalidah Kennith GainN Karim, CNM

## 2015-12-01 ENCOUNTER — Ambulatory Visit (INDEPENDENT_AMBULATORY_CARE_PROVIDER_SITE_OTHER): Payer: Medicaid Other | Admitting: Advanced Practice Midwife

## 2015-12-01 ENCOUNTER — Inpatient Hospital Stay (HOSPITAL_COMMUNITY): Payer: Medicaid Other | Admitting: Anesthesiology

## 2015-12-01 ENCOUNTER — Inpatient Hospital Stay (HOSPITAL_COMMUNITY)
Admission: AD | Admit: 2015-12-01 | Discharge: 2015-12-03 | DRG: 775 | Disposition: A | Discharge status: Home / Self Care | Payer: Medicaid Other | Source: Ambulatory Visit | Attending: Obstetrics & Gynecology | Admitting: Obstetrics & Gynecology

## 2015-12-01 ENCOUNTER — Encounter (HOSPITAL_COMMUNITY): Payer: Self-pay | Admitting: *Deleted

## 2015-12-01 VITALS — BP 90/73 | HR 80 | Wt 138.4 lb

## 2015-12-01 DIAGNOSIS — O99324 Drug use complicating childbirth: Secondary | ICD-10-CM | POA: Diagnosis present

## 2015-12-01 DIAGNOSIS — Z3483 Encounter for supervision of other normal pregnancy, third trimester: Secondary | ICD-10-CM | POA: Diagnosis present

## 2015-12-01 DIAGNOSIS — Z87891 Personal history of nicotine dependence: Secondary | ICD-10-CM

## 2015-12-01 DIAGNOSIS — Z3A39 39 weeks gestation of pregnancy: Secondary | ICD-10-CM

## 2015-12-01 DIAGNOSIS — O99824 Streptococcus B carrier state complicating childbirth: Secondary | ICD-10-CM | POA: Diagnosis present

## 2015-12-01 DIAGNOSIS — F1290 Cannabis use, unspecified, uncomplicated: Secondary | ICD-10-CM | POA: Diagnosis present

## 2015-12-01 DIAGNOSIS — D649 Anemia, unspecified: Secondary | ICD-10-CM

## 2015-12-01 DIAGNOSIS — Z3493 Encounter for supervision of normal pregnancy, unspecified, third trimester: Secondary | ICD-10-CM

## 2015-12-01 DIAGNOSIS — O0993 Supervision of high risk pregnancy, unspecified, third trimester: Secondary | ICD-10-CM

## 2015-12-01 LAB — CBC
HEMATOCRIT: 35 % — AB (ref 36.0–46.0)
Hemoglobin: 11.9 g/dL — ABNORMAL LOW (ref 12.0–15.0)
MCH: 26.4 pg (ref 26.0–34.0)
MCHC: 34 g/dL (ref 30.0–36.0)
MCV: 77.8 fL — ABNORMAL LOW (ref 78.0–100.0)
PLATELETS: 246 10*3/uL (ref 150–400)
RBC: 4.5 MIL/uL (ref 3.87–5.11)
RDW: 17.2 % — AB (ref 11.5–15.5)
WBC: 9.6 10*3/uL (ref 4.0–10.5)

## 2015-12-01 LAB — POCT URINALYSIS DIP (DEVICE)
BILIRUBIN URINE: NEGATIVE
GLUCOSE, UA: NEGATIVE mg/dL
Hgb urine dipstick: NEGATIVE
Ketones, ur: NEGATIVE mg/dL
Nitrite: NEGATIVE
Protein, ur: NEGATIVE mg/dL
Specific Gravity, Urine: 1.02 (ref 1.005–1.030)
Urobilinogen, UA: 1 mg/dL (ref 0.0–1.0)
pH: 6.5 (ref 5.0–8.0)

## 2015-12-01 LAB — TYPE AND SCREEN
ABO/RH(D): O POS
Antibody Screen: NEGATIVE

## 2015-12-01 MED ORDER — OXYCODONE-ACETAMINOPHEN 5-325 MG PO TABS: 1.0000 | ORAL_TABLET | ORAL | Status: DC | PRN

## 2015-12-01 MED ORDER — SOD CITRATE-CITRIC ACID 500-334 MG/5ML PO SOLN: 30.0000 mL | ORAL | Status: DC | PRN

## 2015-12-01 MED ORDER — OXYCODONE-ACETAMINOPHEN 5-325 MG PO TABS: 2.0000 | ORAL_TABLET | ORAL | Status: DC | PRN

## 2015-12-01 MED ORDER — SODIUM CHLORIDE 0.9 % IV SOLN
2.0000 g | Freq: Once | INTRAVENOUS | Status: AC
  Administered 2015-12-02: 2 g via INTRAVENOUS
  Filled 2015-12-01: qty 2000

## 2015-12-01 MED ORDER — EPHEDRINE 5 MG/ML INJ
10.0000 mg | INTRAVENOUS | Status: DC | PRN
  Filled 2015-12-01: qty 4

## 2015-12-01 MED ORDER — FLEET ENEMA 7-19 GM/118ML RE ENEM: 1.0000 | ENEMA | RECTAL | Status: DC | PRN

## 2015-12-01 MED ORDER — LIDOCAINE HCL (PF) 1 % IJ SOLN
30.0000 mL | INTRAMUSCULAR | Status: DC | PRN
  Filled 2015-12-01: qty 30

## 2015-12-01 MED ORDER — LACTATED RINGERS IV SOLN: 500.0000 mL | INTRAVENOUS | Status: DC | PRN

## 2015-12-01 MED ORDER — PENICILLIN G POTASSIUM 5000000 UNITS IJ SOLR: 5.0000 10*6.[IU] | Freq: Once | INTRAVENOUS | Status: DC

## 2015-12-01 MED ORDER — FENTANYL 2.5 MCG/ML BUPIVACAINE 1/10 % EPIDURAL INFUSION (WH - ANES)
14.0000 mL/h | INTRAMUSCULAR | Status: DC | PRN
  Administered 2015-12-01: 14 mL/h via EPIDURAL
  Filled 2015-12-01: qty 125

## 2015-12-01 MED ORDER — DIPHENHYDRAMINE HCL 50 MG/ML IJ SOLN: 12.5000 mg | INTRAMUSCULAR | Status: DC | PRN

## 2015-12-01 MED ORDER — PHENYLEPHRINE 40 MCG/ML (10ML) SYRINGE FOR IV PUSH (FOR BLOOD PRESSURE SUPPORT)
80.0000 ug | PREFILLED_SYRINGE | INTRAVENOUS | Status: DC | PRN
  Administered 2015-12-02: 80 ug via INTRAVENOUS
  Filled 2015-12-01: qty 5

## 2015-12-01 MED ORDER — LIDOCAINE HCL (PF) 1 % IJ SOLN
INTRAMUSCULAR | Status: DC | PRN
  Administered 2015-12-01 (×2): 5 mL

## 2015-12-01 MED ORDER — LACTATED RINGERS IV SOLN
INTRAVENOUS | Status: DC
  Administered 2015-12-01: via INTRAVENOUS

## 2015-12-01 MED ORDER — OXYTOCIN BOLUS FROM INFUSION
500.0000 mL | Freq: Once | INTRAVENOUS | Status: AC
  Administered 2015-12-02: 500 mL via INTRAVENOUS

## 2015-12-01 MED ORDER — PHENYLEPHRINE 40 MCG/ML (10ML) SYRINGE FOR IV PUSH (FOR BLOOD PRESSURE SUPPORT)
80.0000 ug | PREFILLED_SYRINGE | INTRAVENOUS | Status: DC | PRN
  Filled 2015-12-01: qty 5
  Filled 2015-12-01: qty 10

## 2015-12-01 MED ORDER — OXYTOCIN 40 UNITS IN LACTATED RINGERS INFUSION - SIMPLE MED
2.5000 [IU]/h | INTRAVENOUS | Status: DC
  Filled 2015-12-01: qty 1000

## 2015-12-01 MED ORDER — PENICILLIN G POTASSIUM 5000000 UNITS IJ SOLR: 2.5000 10*6.[IU] | INTRAVENOUS | Status: DC

## 2015-12-01 MED ORDER — LACTATED RINGERS IV SOLN
500.0000 mL | Freq: Once | INTRAVENOUS | Status: AC
  Administered 2015-12-01: 500 mL via INTRAVENOUS

## 2015-12-01 MED ORDER — ACETAMINOPHEN 325 MG PO TABS: 650.0000 mg | ORAL_TABLET | ORAL | Status: DC | PRN

## 2015-12-01 MED ORDER — ONDANSETRON HCL 4 MG/2ML IJ SOLN
4.0000 mg | Freq: Four times a day (QID) | INTRAMUSCULAR | Status: DC | PRN
  Filled 2015-12-01: qty 2

## 2015-12-01 NOTE — H&P (Signed)
LABOR AND DELIVERY ADMISSION HISTORY AND PHYSICAL NOTE  Shelly Hodges is a 40 y.o. female G2P1001 with IUP at 7577w5d by LMP presenting for SOL Reports regular contractions since around 7pm tonight.  She reports positive fetal movement. She denies leakage of fluid or vaginal bleeding.  Prenatal History/Complications: Clinic [facility] Prenatal Labs  Dating LMP c/w 25 weeks US Blood type:   0+  Genetic Screen NIPS: declined Antibody: neg  Anatomic US nml Rubella:  immune  GTT Early:    11089           Third trimester: 130 RPR:   NR  Flu vaccine  Declined HBsAg:   Neg  TDaP vaccine 09/07/15                                        HIV:   NR  Baby Food  Both                                     GBS: positive  Contraception  Undecided Pap:nml except for trich on the pap  Circumcision Wants circ   Pediatrician List given   Support Person  [facility] ([facility])    - AMA - Marijuana use (+UDS this pregnancy) - Hgb A2  Past Medical History: Past Medical History:  Diagnosis Date  . Medical history non-contributory     Past Surgical History: Past Surgical History:  Procedure Laterality Date  . NO PAST SURGERIES      Obstetrical History: OB History    Gravida Para Term Preterm AB Living   2 1 1     1    SAB TAB Ectopic Multiple Live Births           1      Social History: Social History   Social History  . Marital status: Single    Spouse name: N/A  . Number of children: N/A  . Years of education: N/A   Social History Main Topics  . Smoking status: Former Games developermoker  . Smokeless tobacco: Never Used  . Alcohol use Yes  . Drug use:     Types: Marijuana  . Sexual activity: Yes    Birth control/ protection: Condom   Other Topics Concern  . None   Social History Narrative  . None    Family History: Family History  Problem Relation Age of Onset  . Sickle cell trait Maternal Aunt   . Sickle cell trait Maternal Aunt     Allergies: No Known Allergies  Prescriptions Prior to Admission   Medication Sig Dispense Refill Last Dose  . Ferrous Sulfate (IRON SLOW RELEASE PO) Take 1 tablet by mouth daily.   Taking     Review of Systems  All systems reviewed and negative except as stated in HPI  Blood pressure 115/88, pulse 94, temperature 98.1 F (36.7 C), temperature source Oral, resp. rate 18, height 5\' 4"  (1.626 m), weight 137 lb 4 oz (62.3 kg), last menstrual period 02/26/2015. General appearance: in moderate distress with contractions  Lungs: normal WOB, clear to auscultation bilaterally Heart: regular rate and rhythm Abdomen: soft, non-tender Extremities: No calf swelling or tenderness Presentation: cephalic by RN exam Fetal monitoring: baseline rate 140, moderate variability, +acel, no decel Uterine activity: ctx every 3-6 min Dilation: 5 Effacement (%): 90 Station: -2 Exam by:: [employer]  Dannette Barbara RN    Prenatal labs: ABO, Rh: --/--/O POS (09/12 2210) Antibody: NEG (09/12 2210) Rubella: immune RPR: NON REAC (06/19 1217)  HBsAg: NEGATIVE (05/22 1052)  HIV: NONREACTIVE (06/19 1217)  GBS: Positive (08/15 0000)  1 hr Glucola: 89 (early), 130 (3rd trimester) Genetic screening:  declined Anatomy US: normal  Prenatal Transfer Tool  Maternal Diabetes: No Genetic Screening: Declined Maternal Ultrasounds/Referrals: Normal Fetal Ultrasounds or other Referrals:  None Maternal Substance Abuse:  Yes:  Type: Marijuana Significant Maternal Medications:  None Significant Maternal Lab Results: Lab values include: Group B Strep positive  Results for orders placed or performed during the hospital encounter of 12/01/15 (from the past 24 hour(s))  CBC   Collection Time: 12/01/15 10:10 PM  Result Value Ref Range   WBC 9.6 4.0 - 10.5 K/uL   RBC 4.50 3.87 - 5.11 MIL/uL   Hemoglobin 11.9 (L) 12.0 - 15.0 g/dL   HCT 16.1 (L) 09.6 - 04.5 %   MCV 77.8 (L) 78.0 - 100.0 fL   MCH 26.4 26.0 - 34.0 pg   MCHC 34.0 30.0 - 36.0 g/dL   RDW 40.9 (H) 81.1 - 91.4 %   Platelets 246 150 -  400 K/uL  Type and screen [facility]   Collection Time: 12/01/15 10:10 PM  Result Value Ref Range   ABO/RH(D) O POS    Antibody Screen NEG    Sample Expiration 12/04/2015   Results for orders placed or performed in visit on 12/01/15 (from the past 24 hour(s))  POCT urinalysis dip (device)   Collection Time: 12/01/15 11:21 AM  Result Value Ref Range   Glucose, UA NEGATIVE NEGATIVE mg/dL   Bilirubin Urine NEGATIVE NEGATIVE   Ketones, ur NEGATIVE NEGATIVE mg/dL   Specific Gravity, Urine 1.020 1.005 - 1.030   Hgb urine dipstick NEGATIVE NEGATIVE   pH 6.5 5.0 - 8.0   Protein, ur NEGATIVE NEGATIVE mg/dL   Urobilinogen, UA 1.0 0.0 - 1.0 mg/dL   Nitrite NEGATIVE NEGATIVE   Leukocytes, UA TRACE (A) NEGATIVE    Patient Active Problem List   Diagnosis Date Noted  . Poor weight gain of pregnancy 10/12/2015  . Abnormal hemoglobin (HCC) 09/23/2015  . Anemia 09/08/2015  . Marijuana use 08/19/2015  . Antepartum multigravida of advanced maternal age 54/22/2017  . Supervision of high risk pregnancy, antepartum 08/10/2015    Assessment: Shelly Hodges is a 40 y.o. G2P1001 with IUP at [redacted]w[redacted]d here for SOL.  #labor: Expectant management #Pain: Wants epidural #FWB: Category I #ID:  GBS+, will given Amp given advanced labor #MOF: bottle #MOC:undecided #Circ:  Yes, outpatient   Kandra Nicolas Degele 12/01/2015, 11:29 PM   OB FELLOW HISTORY AND PHYSICAL ATTESTATION  I have seen and examined this patient; I agree with above documentation in the resident's note.    Jen Mow, DO OB Fellow 12/02/2015, 12:06 AM

## 2015-12-01 NOTE — Progress Notes (Signed)
Patient ID: Shelly Hodges, female   DOB: 11-18-1975, 40 y.o.   MRN: [id number]   PRENATAL VISIT NOTE  Subjective:  Shelly CockingCarreshiya L Peruski is a 40 y.o. G2P1001 at 3829w5d being seen today for ongoing prenatal care.  She is currently monitored for the following issues for this low-risk pregnancy and has Antepartum multigravida of advanced maternal age; Supervision of high risk pregnancy, antepartum; Marijuana use; Anemia; Abnormal hemoglobin (HCC); and Poor weight gain of pregnancy on her problem list.  Patient reports carpal tunnel symptoms, no bleeding, no leaking and occasional contractions.  Contractions: Irritability. Vag. Bleeding: None.  Movement: Present. Denies leaking of fluid.   The pt stated that she would like to have labor induced as she is very uncomfortable and not able to sleep well at night.  The following portions of the patient's history were reviewed and updated as appropriate: allergies, current medications, past family history, past medical history, past social history, past surgical history and problem list. Problem list updated.  Objective:   Vitals:   12/01/15 0803  BP: 90/73  Pulse: 80  Weight: 138 lb 6.4 oz (62.8 kg)    Fetal Status: Fetal Heart Rate (bpm): 137 Fundal Height: 38 cm Movement: Present     General:  Alert, oriented and cooperative. Patient is in no acute distress.  Skin: Skin is warm and dry. No rash noted.   Cardiovascular: Normal heart rate noted  Respiratory: Normal respiratory effort, no problems with respiration noted  Abdomen: Soft, gravid, appropriate for gestational age. Pain/Pressure: Present     Pelvic:  Cervical exam performed      3.5/50/-2, vtx  Extremities: Normal range of motion.  Edema: Trace  Mental Status: Normal mood and affect. Normal behavior. Normal judgment and thought content.   Urinalysis:      Assessment and Plan:  Pregnancy: G2P1001 at 4929w5d  1. Supervision of high risk pregnancy, antepartum, third trimester - Pt  informed that induction not routinely performed prior to 41w unless medically indicated. -Cervical check performed and membranes stretched.  Pt educated that some spotting may occur after this.  Term labor symptoms and general obstetric precautions including but not limited to vaginal bleeding, contractions, leaking of fluid and fetal movement were reviewed in detail with the patient. Please refer to After Visit Summary for other counseling recommendations.  Return in about 6 days (around 12/07/2015) for ROB/NST.  Wilborn Membreno Aletha HalimK Mahlani Berninger, Student-PA

## 2015-12-01 NOTE — Anesthesia Preprocedure Evaluation (Signed)

## 2015-12-01 NOTE — Anesthesia Procedure Notes (Signed)
Epidural Patient location during procedure: OB  Staffing Anesthesiologist: Rim Thatch Performed: anesthesiologist   Preanesthetic Checklist Completed: patient identified, site marked, surgical consent, pre-op evaluation, timeout performed, IV checked, risks and benefits discussed and monitors and equipment checked  Epidural Patient position: sitting Prep: DuraPrep Patient monitoring: heart rate, continuous pulse ox and blood pressure Approach: midline Location: L3-L4 Injection technique: LOR saline  Needle:  Needle type: Tuohy  Needle gauge: 17 G Needle length: 9 cm and 9 Needle insertion depth: 5 cm Catheter type: closed end flexible Catheter size: 20 Guage Catheter at skin depth: 9 cm Test dose: negative  Assessment Events: blood not aspirated, injection not painful, no injection resistance, negative IV test and no paresthesia  Additional Notes Patient identified. Risks/Benefits/Options discussed with patient including but not limited to bleeding, infection, nerve damage, paralysis, failed block, incomplete pain control, headache, blood pressure changes, nausea, vomiting, reactions to medication both or allergic, itching and postpartum back pain. Confirmed with bedside nurse the patient's most recent platelet count. Confirmed with patient that they are not currently taking any anticoagulation, have any bleeding history or any family history of bleeding disorders. Patient expressed understanding and wished to proceed. All questions were answered. Sterile technique was used throughout the entire procedure. Please see nursing notes for vital signs. Test dose was given through epidural needle and negative prior to continuing to dose epidural or start infusion. Warning signs of high block given to the patient including shortness of breath, tingling/numbness in hands, complete motor block, or any concerning symptoms with instructions to call for help. Patient was given instructions  on fall risk and not to get out of bed. All questions and concerns addressed with instructions to call with any issues.     

## 2015-12-01 NOTE — MAU Note (Signed)
Notified provider that patient is 5/90/-2. Ctx every 2-3 mins. GBS +. Provider said to admit patient to L&D

## 2015-12-01 NOTE — Progress Notes (Signed)
Declines flu 

## 2015-12-01 NOTE — Patient Instructions (Signed)
Braxton Hicks Contractions °Contractions of the uterus can occur throughout pregnancy. Contractions are not always a sign that you are in labor.  °WHAT ARE BRAXTON HICKS CONTRACTIONS?  °Contractions that occur before labor are called Braxton Hicks contractions, or false labor. Toward the end of pregnancy (32-34 weeks), these contractions can develop more often and may become more forceful. This is not true labor because these contractions do not result in opening (dilatation) and thinning of the cervix. They are sometimes difficult to tell apart from true labor because these contractions can be forceful and people have different pain tolerances. You should not feel embarrassed if you go to the hospital with false labor. Sometimes, the only way to tell if you are in true labor is for your health care provider to look for changes in the cervix. °If there are no prenatal problems or other health problems associated with the pregnancy, it is completely safe to be sent home with false labor and await the onset of true labor. °HOW CAN YOU TELL THE DIFFERENCE BETWEEN TRUE AND FALSE LABOR? °False Labor °· The contractions of false labor are usually shorter and not as hard as those of true labor.   °· The contractions are usually irregular.   °· The contractions are often felt in the front of the lower abdomen and in the groin.   °· The contractions may go away when you walk around or change positions while lying down.   °· The contractions get weaker and are shorter lasting as time goes on.   °· The contractions do not usually become progressively stronger, regular, and closer together as with true labor.   °True Labor °· Contractions in true labor last 30-70 seconds, become very regular, usually become more intense, and increase in frequency.   °· The contractions do not go away with walking.   °· The discomfort is usually felt in the top of the uterus and spreads to the lower abdomen and low back.   °· True labor can be  determined by your health care provider with an exam. This will show that the cervix is dilating and getting thinner.   °WHAT TO REMEMBER °· Keep up with your usual exercises and follow other instructions given by your health care provider.   °· Take medicines as directed by your health care provider.   °· Keep your regular prenatal appointments.   °· Eat and drink lightly if you think you are going into labor.   °· If Braxton Hicks contractions are making you uncomfortable:   °¨ Change your position from lying down or resting to walking, or from walking to resting.   °¨ Sit and rest in a tub of warm water.   °¨ Drink 2-3 glasses of water. Dehydration may cause these contractions.   °¨ Do slow and deep breathing several times an hour.   °WHEN SHOULD I SEEK IMMEDIATE MEDICAL CARE? °Seek immediate medical care if: °· Your contractions become stronger, more regular, and closer together.   °· You have fluid leaking or gushing from your vagina.   °· You have a fever.   °· You pass blood-tinged mucus.   °· You have vaginal bleeding.   °· You have continuous abdominal pain.   °· You have low back pain that you never had before.   °· You feel your baby's head pushing down and causing pelvic pressure.   °· Your baby is not moving as much as it used to.   °  °This information is not intended to replace advice given to you by your health care provider. Make sure you discuss any questions you have with your health care   provider. °  °Document Released: 03/07/2005 Document Revised: 03/12/2013 Document Reviewed: 12/17/2012 °Elsevier Interactive Patient Education ©2016 [employer] ° °

## 2015-12-01 NOTE — MAU Note (Signed)
PT  SAYS SHE HURTS  WITH UC   SINCE   7 PM.     VE IN OFFICE   TODAY   3  CM.-  STRIPPED MEMBRANES.     DENIES HSV AND MRSA.    GBS-  POSITIVE.

## 2015-12-02 ENCOUNTER — Encounter (HOSPITAL_COMMUNITY): Payer: Self-pay | Admitting: *Deleted

## 2015-12-02 DIAGNOSIS — Z3A39 39 weeks gestation of pregnancy: Secondary | ICD-10-CM

## 2015-12-02 DIAGNOSIS — O99824 Streptococcus B carrier state complicating childbirth: Secondary | ICD-10-CM

## 2015-12-02 LAB — ABO/RH: ABO/RH(D): O POS

## 2015-12-02 LAB — RPR: RPR Ser Ql: NONREACTIVE

## 2015-12-02 MED ORDER — SENNOSIDES-DOCUSATE SODIUM 8.6-50 MG PO TABS
2.0000 | ORAL_TABLET | ORAL | Status: DC
  Administered 2015-12-02: 2 via ORAL
  Filled 2015-12-02: qty 2

## 2015-12-02 MED ORDER — ACETAMINOPHEN 325 MG PO TABS: 650.0000 mg | ORAL_TABLET | ORAL | Status: DC | PRN

## 2015-12-02 MED ORDER — IBUPROFEN 600 MG PO TABS
600.0000 mg | ORAL_TABLET | Freq: Four times a day (QID) | ORAL | Status: DC
  Administered 2015-12-02 – 2015-12-03 (×6): 600 mg via ORAL
  Filled 2015-12-02 (×6): qty 1

## 2015-12-02 MED ORDER — ONDANSETRON HCL 4 MG/2ML IJ SOLN: 4.0000 mg | INTRAMUSCULAR | Status: DC | PRN

## 2015-12-02 MED ORDER — COCONUT OIL OIL: 1.0000 "application " | TOPICAL_OIL | Status: DC | PRN

## 2015-12-02 MED ORDER — TETANUS-DIPHTH-ACELL PERTUSSIS 5-2.5-18.5 LF-MCG/0.5 IM SUSP: 0.5000 mL | Freq: Once | INTRAMUSCULAR | Status: DC

## 2015-12-02 MED ORDER — ONDANSETRON HCL 4 MG PO TABS: 4.0000 mg | ORAL_TABLET | ORAL | Status: DC | PRN

## 2015-12-02 MED ORDER — PRENATAL MULTIVITAMIN CH
1.0000 | ORAL_TABLET | Freq: Every day | ORAL | Status: DC
  Administered 2015-12-02 – 2015-12-03 (×2): 1 via ORAL
  Filled 2015-12-02 (×2): qty 1

## 2015-12-02 MED ORDER — SIMETHICONE 80 MG PO CHEW: 80.0000 mg | CHEWABLE_TABLET | ORAL | Status: DC | PRN

## 2015-12-02 MED ORDER — DIPHENHYDRAMINE HCL 25 MG PO CAPS: 25.0000 mg | ORAL_CAPSULE | Freq: Four times a day (QID) | ORAL | Status: DC | PRN

## 2015-12-02 MED ORDER — WITCH HAZEL-GLYCERIN EX PADS: 1.0000 "application " | MEDICATED_PAD | CUTANEOUS | Status: DC | PRN

## 2015-12-02 MED ORDER — ZOLPIDEM TARTRATE 5 MG PO TABS: 5.0000 mg | ORAL_TABLET | Freq: Every evening | ORAL | Status: DC | PRN

## 2015-12-02 MED ORDER — DIBUCAINE 1 % RE OINT: 1.0000 "application " | TOPICAL_OINTMENT | RECTAL | Status: DC | PRN

## 2015-12-02 MED ORDER — BENZOCAINE-MENTHOL 20-0.5 % EX AERO
1.0000 "application " | INHALATION_SPRAY | CUTANEOUS | Status: DC | PRN
  Administered 2015-12-03: 1 via TOPICAL
  Filled 2015-12-02 (×2): qty 56

## 2015-12-02 NOTE — Progress Notes (Signed)
UR chart review completed.  

## 2015-12-02 NOTE — Progress Notes (Signed)
Patient informed of the pneumonia vaccination.  Patient stated she quit smoking in January 2017 after she found out she was pregnant.  Patient is unsure if she wants the vaccine at this time.

## 2015-12-02 NOTE — Progress Notes (Signed)
I agree with the am assessement. Plan of care explained to mother.

## 2015-12-02 NOTE — Anesthesia Postprocedure Evaluation (Signed)
Anesthesia Post Note  Patient: Shelly Hodges  Procedure(s) Performed: * No procedures listed *  Patient location during evaluation: Mother Baby Anesthesia Type: Epidural Level of consciousness: awake Pain management: satisfactory to patient Vital Signs Assessment: post-procedure vital signs reviewed and stable Respiratory status: spontaneous breathing Cardiovascular status: stable Anesthetic complications: no     Last Vitals:  Vitals:   12/02/15 0425 12/02/15 0830  BP: 129/63 110/69  Pulse: 89 80  Resp: 16 16  Temp: 36.8 C 36.9 C    Last Pain:  Vitals:   12/02/15 0830  TempSrc: Oral  PainSc: 0-No pain   Pain Goal: Patients Stated Pain Goal: 3 (12/01/15 2356)               Cephus ShellingBURGER,Ailie Gage

## 2015-12-03 MED ORDER — SENNOSIDES-DOCUSATE SODIUM 8.6-50 MG PO TABS: 2.0000 | ORAL_TABLET | ORAL | 0 refills | Status: DC

## 2015-12-03 MED ORDER — IBUPROFEN 600 MG PO TABS: 600.0000 mg | ORAL_TABLET | Freq: Four times a day (QID) | ORAL | 0 refills | Status: DC

## 2015-12-03 MED ORDER — PRENATAL MULTIVITAMIN CH: 1.0000 | ORAL_TABLET | Freq: Every day | ORAL | 0 refills | Status: DC

## 2015-12-03 NOTE — Clinical Social Work Maternal (Signed)
CLINICAL SOCIAL WORK MATERNAL/CHILD NOTE  Patient Details  Name: Shelly Hodges MRN: [id number] Date of Birth: 02-08-1976  Date:  12/03/2015  Clinical Social Worker Initiating Note:  [place], [place] Date/ Time Initiated:  12/03/15/1054     Child's Name:  Ronny Flurry   Legal Guardian:  Mother   Need for Interpreter:  None   Date of Referral:  12/03/15     Reason for Referral:  Current Substance Use/Substance Use During Pregnancy    Referral Source:  RN   Address:  [address], [place] [address]  Phone number:  [phone]   Household Members:  Significant Other   Natural Supports (not living in the home):  Children, Parent, Extended Family, Friends   Professional Supports: None   Employment: Part-time   Type of Work: Radio broadcast assistant at [employer]    Education:  [employer] school Herbalist Resources:  [employer]   Other Resources:  [employer], Physicist, medical    Cultural/Religious Considerations Which May Impact Care:  none reported  Strengths:  Ability to meet basic needs , Compliance with medical plan , Pediatrician chosen    Risk Factors/Current Problems:  Substance Use    Cognitive State:  Able to Concentrate , Alert , Insightful , Goal Oriented    Mood/Affect:  Happy , Interested , Comfortable    CSW Assessment: LCSW received consult for drug exposed newborn, with positive UDS on admission.  LCSW met with MOB alone in room to complete assessment of substance use and psycho-social. MOB was very welcoming and engaged in assessment. She was forth coming of information and did confirm THC use throughout pregnancy due to not feeling well and by habit.  She reports she does not have a problem and with her other son (who is 51 years old) she was not using THC.  She reports she understands hospital policy and made aware of CPS report being made by Probation officer. She questions next steps in which LCSW give different scenarios of what could happen  if case screened in vs screened out.  She denies any other substances and reports she will not breastfeed because she has smoked and it was recommended by OB not to breastfeed.  She was honest with her OB and providers throughout pregnancy of her drug use.   MOB reports she has used THC as a coping mechanism and it has become a habit when not feeling well or anxious. She denies any mental health symptoms at this time. She does report positive support from her boyfriend who is involved and they live together. She reports she will return to his home. She reports her mother, son and friends are all involved and will support child and self. She reports she has all she needs for baby except a pack in play in which she requests. LCSW was able to meet with family support network and there was a pack in play in Elizabeth's closet and LCSW had MOB sign forms and given to her for safe sleep. LCSW reviewed safe sleep and gave education. LCSW reviewed PPD and PPA regarding signs and symptoms and how to get help. She denies any formal consults or resources at this time. She is aware of CPS report, and CPS will follow up with MOB if case accepted. LCSW will follow cord and submit information to CPS once available. NO other needs, RN informed of completed assessment and no barriers to DC at this time.  CSW Plan/Description:  Child Protective Service Report , Information/Referral  to [employer] , [employer] , No Further Intervention Required/No Barriers to Discharge    Marshell Garfinkel 12/03/2015, 10:56 AM

## 2015-12-03 NOTE — Discharge Instructions (Signed)

## 2015-12-03 NOTE — Discharge Summary (Signed)
OB Discharge Summary     Patient Name: Shelly Hodges DOB: 08/04/1975 MRN: [id number]  Date of admission: 12/01/2015 Delivering MD: Michaele Offer   Date of discharge: 12/03/2015  Admitting diagnosis: 40w ctx 5 min, pressure, labor pains Intrauterine pregnancy: [redacted]w[redacted]d     Secondary diagnosis:  Active Problems:   SVD (spontaneous vaginal delivery)  Additional problems: AMA, marijuana use, HgbA2     Discharge diagnosis: Term Pregnancy Delivered                                                                                                Post partum procedures:none  Augmentation: AROM  Complications: precipitous labor (<3 hours)  Hospital course:  Onset of Labor With Vaginal Delivery     40 y.o. yo [id number] at [redacted]w[redacted]d was admitted in Active Labor on 12/01/2015. Patient had an uncomplicated labor course as follows:  Membrane Rupture Time/Date: 12:57 AM ,12/01/2015   Intrapartum Procedures: Episiotomy: None [1]                                         Lacerations:  None [1]  Patient had a delivery of a Viable infant. 12/02/2015  Information for the patient's newborn:  Shelly, Hodges [[id number]]  Delivery Method: Vag-Spont    Pateint had an uncomplicated postpartum course.  She is ambulating, tolerating a regular diet, passing flatus, and urinating well. Patient is discharged home in stable condition on 12/03/15.    Physical exam Vitals:   12/02/15 0425 12/02/15 0830 12/02/15 1805 12/03/15 0500  BP: 129/63 110/69 140/80 115/85  Pulse: 89 80 84 76  Resp: 16 16 18 18   Temp: 98.3 F (36.8 C) 98.5 F (36.9 C) 97.9 F (36.6 C) 97.6 F (36.4 C)  TempSrc: Oral Oral Oral Oral  SpO2:      Weight:      Height:       General: alert, cooperative and no distress Lochia: appropriate Uterine Fundus: firm DVT Evaluation: No evidence of DVT seen on physical exam. Labs: Lab Results  Component Value Date   WBC 9.6 12/01/2015   HGB 11.9 (L) 12/01/2015   HCT 35.0  (L) 12/01/2015   MCV 77.8 (L) 12/01/2015   PLT 246 12/01/2015   CMP Latest Ref Rng & Units 11/07/2008  Glucose 70 - 99 mg/dL 96  BUN 6 - 23 mg/dL 12  Creatinine 0.4 - 1.2 mg/dL 1.30  Sodium 865 - 784 mEq/L 138  Potassium 3.5 - 5.1 mEq/L 4.2  Chloride 96 - 112 mEq/L 107  CO2 19 - 32 mEq/L 25  Calcium 8.4 - 10.5 mg/dL 9.3  Total Protein 6.0 - 8.3 g/dL 7.0  Total Bilirubin 0.3 - 1.2 mg/dL 0.9  Alkaline Phos 39 - 117 U/L 43  AST 0 - 37 U/L 19  ALT 0 - 35 U/L 12    Discharge instruction: per After Visit Summary and "Baby and Me Booklet".  After visit meds:    Medication List    TAKE these  medications   ibuprofen 600 MG tablet Commonly known as:  ADVIL,MOTRIN Take 1 tablet (600 mg total) by mouth every 6 (six) hours.   IRON SLOW RELEASE PO Take 1 tablet by mouth daily.   prenatal multivitamin Tabs tablet Take 1 tablet by mouth daily at 12 noon.   senna-docusate 8.6-50 MG tablet Commonly known as:  Senokot-S Take 2 tablets by mouth daily. Start taking on:  12/04/2015       Diet: routine diet  Activity: Advance as tolerated. Pelvic rest for 6 weeks.   Outpatient follow up:6 weeks Follow up Appt:No future appointments. Follow up Visit: Follow-up Information    [facility]   Specialty:  Obstetrics and Gynecology Why:  postpartum visit Contact information: [address] [place] [id number] [phone]         Postpartum contraception: Depo Provera  Newborn Data: Live born female  Birth Weight: 7 lb 6.3 oz (3354 g) APGAR: 8, 9  Baby Feeding: Bottle Disposition:home with mother   12/03/2015 Leland HerElsia J Yoo, DO PGY-1  CNM attestation I have seen and examined this patient and agree with above documentation in the resident's note.   Shelly Hodges is a 40 y.o. [id number] s/p SVD.   Pain is well controlled.  Plan for birth control is Depo-Provera.  Method of Feeding: bottle  PE:  BP 115/85  (BP Location: Right Arm)   Pulse 76   Temp 97.6 F (36.4 C) (Oral)   Resp 18   Ht 5\' 4"  (1.626 m)   Wt 62.3 kg (137 lb 4 oz)   LMP 02/26/2015 (Within Days)   SpO2 100%   Breastfeeding? Unknown   BMI 23.56 kg/m  Fundus firm   Recent Labs  12/01/15 2210  HGB 11.9*  HCT 35.0*     Plan: discharge today - postpartum care discussed - f/u clinic in 6 weeks for postpartum visit   Shelly Hodges, CNM 8:17 PM

## 2015-12-03 NOTE — Progress Notes (Signed)
I performed the exam and agree with above.  [place] Tkai Serfass, CNM 12/03/2015 8:52 PM

## 2015-12-08 ENCOUNTER — Other Ambulatory Visit: Payer: Medicaid Other | Admitting: Certified Nurse Midwife

## 2016-01-12 ENCOUNTER — Ambulatory Visit (INDEPENDENT_AMBULATORY_CARE_PROVIDER_SITE_OTHER): Payer: Medicaid Other | Admitting: Obstetrics and Gynecology

## 2016-01-12 ENCOUNTER — Encounter: Payer: Self-pay | Admitting: Obstetrics and Gynecology

## 2016-01-12 DIAGNOSIS — Z3042 Encounter for surveillance of injectable contraceptive: Secondary | ICD-10-CM | POA: Diagnosis present

## 2016-01-12 LAB — POCT PREGNANCY, URINE: Preg Test, Ur: NEGATIVE

## 2016-01-12 MED ORDER — MEDROXYPROGESTERONE ACETATE 150 MG/ML IM SUSP
150.0000 mg | Freq: Once | INTRAMUSCULAR | Status: AC
  Administered 2016-01-12: 150 mg via INTRAMUSCULAR

## 2016-01-12 NOTE — Progress Notes (Signed)
2Subjective:     Shelly CockingCarreshiya L Hodges is a 40 y.o. female who presents for a postpartum visit. She is 6 weeks postpartum following a spontaneous vaginal delivery. I have fully reviewed the prenatal and intrapartum course. The delivery was at 39.[redacted] wks gestational weeks. Outcome: spontaneous vaginal delivery. Anesthesia: epidural. Postpartum course has been uncomplicated. Baby's course has been uncomplicated. Baby is feeding by bottle - Neocate and Similac Advance. Bleeding no bleeding. Bowel function is normal. Bladder function is normal. Patient is not sexually active. Contraception method: desires Depo-Provera injections. Postpartum depression screening: negative.  The following portions of the patient's history were reviewed and updated as appropriate: allergies, current medications, past family history, past medical history, past social history, past surgical history and problem list.  Review of Systems Pertinent items are noted in HPI.   Objective:    Wt 132 lb 1.6 oz (59.9 kg)   Breastfeeding? No   BMI 22.67 kg/m   General:  alert, cooperative and no distress   Breasts:  inspection negative, no nipple discharge or bleeding, no masses or nodularity palpable  Lungs: clear to auscultation bilaterally  Heart:  regular rate and rhythm, S1, S2 normal, no murmur, click, rub or gallop  Abdomen: soft, non-tender; bowel sounds normal; no masses,  no organomegaly   Vulva:  not evaluated  Vagina: not evaluated  Cervix:  not evaluated  Corpus: not examined  Adnexa:  not evaluated  Rectal Exam: Not performed.        Assessment:     6 wk postpartum exam. Pap smear not done at today's visit.   Plan:    1. Contraception: Depo-Provera injections will start today 2. Discussed sit-ups and abdominal tightening for large rectus diastasis 3. Follow up in: 6 months or as needed.

## 2016-01-12 NOTE — Patient Instructions (Signed)

## 2016-01-12 NOTE — Addendum Note (Signed)
Addended by: Faythe CasaBELLAMY, Ad Guttman M on: 01/12/2016 12:01 PM   Modules accepted: Orders

## 2016-05-03 ENCOUNTER — Ambulatory Visit: Payer: Medicaid Other | Admitting: *Deleted

## 2016-05-03 LAB — POCT PREGNANCY, URINE: Preg Test, Ur: NEGATIVE

## 2016-05-17 ENCOUNTER — Ambulatory Visit (INDEPENDENT_AMBULATORY_CARE_PROVIDER_SITE_OTHER): Payer: Medicaid Other

## 2016-05-17 VITALS — BP 137/75 | HR 61

## 2016-05-17 DIAGNOSIS — Z3042 Encounter for surveillance of injectable contraceptive: Secondary | ICD-10-CM

## 2016-05-17 LAB — POCT PREGNANCY, URINE: Preg Test, Ur: NEGATIVE

## 2016-05-17 MED ORDER — MEDROXYPROGESTERONE ACETATE 150 MG/ML IM SUSP
150.0000 mg | Freq: Once | INTRAMUSCULAR | Status: AC
  Administered 2016-05-17: 150 mg via INTRAMUSCULAR

## 2016-05-17 NOTE — Progress Notes (Signed)
Patient presented to office today for a pregnancy test and Urine Pregnancy test. Patient is currently late on getting her depo-provera.She reports having no intercourse in over three weeks. UPT is negative. Patient has been given her depo provera today. She will follow up in three months.

## 2016-05-17 NOTE — Addendum Note (Signed)
Addended by: Cheree DittoGRAHAM, Sila Sarsfield A on: 05/17/2016 11:00 AM   Modules accepted: Orders

## 2016-08-02 ENCOUNTER — Ambulatory Visit: Payer: Medicaid Other

## 2016-08-03 ENCOUNTER — Ambulatory Visit: Payer: Medicaid Other | Admitting: Advanced Practice Midwife

## 2018-01-11 ENCOUNTER — Encounter: Payer: Self-pay | Admitting: *Deleted

## 2018-04-11 ENCOUNTER — Encounter (HOSPITAL_COMMUNITY): Payer: Self-pay | Admitting: *Deleted

## 2018-04-11 ENCOUNTER — Inpatient Hospital Stay (HOSPITAL_COMMUNITY)
Admission: AD | Admit: 2018-04-11 | Discharge: 2018-04-11 | Disposition: A | Discharge status: Home / Self Care | Payer: Medicaid Other | Source: Ambulatory Visit | Attending: Obstetrics and Gynecology | Admitting: Obstetrics and Gynecology

## 2018-04-11 ENCOUNTER — Inpatient Hospital Stay (HOSPITAL_COMMUNITY): Payer: Medicaid Other

## 2018-04-11 DIAGNOSIS — O209 Hemorrhage in early pregnancy, unspecified: Secondary | ICD-10-CM | POA: Diagnosis not present

## 2018-04-11 DIAGNOSIS — A599 Trichomoniasis, unspecified: Secondary | ICD-10-CM

## 2018-04-11 DIAGNOSIS — N939 Abnormal uterine and vaginal bleeding, unspecified: Secondary | ICD-10-CM

## 2018-04-11 DIAGNOSIS — Z87891 Personal history of nicotine dependence: Secondary | ICD-10-CM | POA: Diagnosis not present

## 2018-04-11 DIAGNOSIS — Z3A11 11 weeks gestation of pregnancy: Secondary | ICD-10-CM | POA: Diagnosis not present

## 2018-04-11 DIAGNOSIS — O98311 Other infections with a predominantly sexual mode of transmission complicating pregnancy, first trimester: Secondary | ICD-10-CM | POA: Diagnosis not present

## 2018-04-11 DIAGNOSIS — A5901 Trichomonal vulvovaginitis: Secondary | ICD-10-CM | POA: Diagnosis not present

## 2018-04-11 DIAGNOSIS — O3680X0 Pregnancy with inconclusive fetal viability, not applicable or unspecified: Secondary | ICD-10-CM | POA: Diagnosis not present

## 2018-04-11 LAB — URINALYSIS, ROUTINE W REFLEX MICROSCOPIC
BACTERIA UA: NONE SEEN
Bilirubin Urine: NEGATIVE
Glucose, UA: NEGATIVE mg/dL
KETONES UR: NEGATIVE mg/dL
Leukocytes, UA: NEGATIVE
Nitrite: NEGATIVE
PROTEIN: 30 mg/dL — AB
Specific Gravity, Urine: 1.028 (ref 1.005–1.030)
pH: 5 (ref 5.0–8.0)

## 2018-04-11 LAB — COMPREHENSIVE METABOLIC PANEL
ALBUMIN: 4.5 g/dL (ref 3.5–5.0)
ALK PHOS: 66 U/L (ref 38–126)
ALT: 25 U/L (ref 0–44)
AST: 17 U/L (ref 15–41)
Anion gap: 8 (ref 5–15)
BUN: 11 mg/dL (ref 6–20)
CHLORIDE: 103 mmol/L (ref 98–111)
CO2: 25 mmol/L (ref 22–32)
Calcium: 9.4 mg/dL (ref 8.9–10.3)
Creatinine, Ser: 0.69 mg/dL (ref 0.44–1.00)
GFR calc non Af Amer: >60 mL/min (ref >60)
Glucose, Bld: 98 mg/dL (ref 70–99)
Potassium: 3.8 mmol/L (ref 3.5–5.1)
SODIUM: 136 mmol/L (ref 135–145)
TOTAL PROTEIN: 7.5 g/dL (ref 6.5–8.1)
Total Bilirubin: 1 mg/dL (ref 0.3–1.2)

## 2018-04-11 LAB — WET PREP, GENITAL
Clue Cells Wet Prep HPF POC: NONE SEEN
SPERM: NONE SEEN
Yeast Wet Prep HPF POC: NONE SEEN

## 2018-04-11 LAB — CBC
HCT: 41.8 % (ref 36.0–46.0)
HEMOGLOBIN: 14 g/dL (ref 12.0–15.0)
MCH: 27.5 pg (ref 26.0–34.0)
MCHC: 33.5 g/dL (ref 30.0–36.0)
MCV: 82 fL (ref 80.0–100.0)
NRBC: 0 % (ref 0.0–0.2)
PLATELETS: 272 10*3/uL (ref 150–400)
RBC: 5.1 MIL/uL (ref 3.87–5.11)
RDW: 14.1 % (ref 11.5–15.5)
WBC: 6.1 10*3/uL (ref 4.0–10.5)

## 2018-04-11 LAB — POCT PREGNANCY, URINE: Preg Test, Ur: POSITIVE — AB

## 2018-04-11 LAB — HCG, QUANTITATIVE, PREGNANCY: hCG, Beta Chain, Quant, S: 6885 m[IU]/mL — ABNORMAL HIGH (ref <5)

## 2018-04-11 MED ORDER — METRONIDAZOLE 500 MG PO TABS
2000.0000 mg | ORAL_TABLET | Freq: Once | ORAL | Status: AC
  Administered 2018-04-11: 2000 mg via ORAL
  Filled 2018-04-11: qty 4

## 2018-04-11 NOTE — MAU Provider Note (Addendum)
History    Chief Complaint  Patient presents with  . Vaginal Bleeding   HPI: [name] is a 43 yo G3P2002 at 8411 by LMP weeks referred here by [facility] after presenting yesterday with vaginal bleeding and no visualized intrauterine pregnancy on ultrasound. Patient reports that her LMP was in November and she had a positive urine pregnancy test on January 1st. Urine pregnancy test was confirmed at [facility] two weeks ago and during the same time, patient experienced RLQ "sharp" abdominal pain with associated "dark urine." The abdominal pain subsided and she has since intermittently felt "pressure/heaviness" in her lower abdomen that changes frequently between RRL, LLQ, and suprapubic location. Patient rates this pain as 4-5/10 on a pain scale. She reports heavy vaginal bleeding since Friday and noticed a marble-sized blood clot on Monday followed by "streads/strings" in the blood since then.Patient attended her ultrasound appointment at [facility] today with findings of no visualized intrauterine pregnancy and no free fluid. Patient has never experiences symptoms like these in her past pregnancies and she has never had an ectopic pregnancy or miscarriage.   Past Medical History:  Diagnosis Date  . Medical history non-contributory    Obstetric history: G3P2002 Previous pregnancies have been uncomplicated, delivered at term, SVD.   Past Surgical History:  Procedure Laterality Date  . NO PAST SURGERIES      Family History  Problem Relation Age of Onset  . Sickle cell trait Maternal Aunt   . Sickle cell trait Maternal Aunt     Social History   Tobacco Use  . Smoking status: Former Games developermoker  . Smokeless tobacco: Never Used  Substance Use Topics  . Alcohol use: Yes  . Drug use: Yes    Types: Marijuana    Allergies: No Known Allergies  Medications Prior to Admission  Medication Sig Dispense Refill Last Dose  . Ferrous Sulfate (IRON SLOW  RELEASE PO) Take 1 tablet by mouth daily.   Not Taking  . ibuprofen (ADVIL,MOTRIN) 600 MG tablet Take 1 tablet (600 mg total) by mouth every 6 (six) hours. (Patient not taking: Reported on 01/12/2016) 30 tablet 0 Not Taking  . Prenatal Vit-Fe Fumarate-FA (PRENATAL MULTIVITAMIN) TABS tablet Take 1 tablet by mouth daily at 12 noon. (Patient not taking: Reported on 01/12/2016) 30 tablet 0 Not Taking  . senna-docusate (SENOKOT-S) 8.6-50 MG tablet Take 2 tablets by mouth daily. (Patient not taking: Reported on 01/12/2016) 30 tablet 0 Not Taking    Review of Systems  Constitutional: Negative for chills and fever.  Gastrointestinal: Positive for abdominal pain (described as "pressure" and "heaviness." has one episode of "sharp" RLQ pain 2 weeks ago), nausea and vomiting. Negative for constipation and diarrhea.  Genitourinary: Negative for dysuria, flank pain, frequency and urgency.       Positive for VB. Negative for vaginal discharge and pain.   Physical Exam Blood pressure 136/81, pulse 73, temperature 98.1 F (36.7 C), resp. rate 20, height 5\' 3"  (1.6 m), weight 68.5 kg, last menstrual period 01/24/2018, SpO2 100 %. Physical Exam  Constitutional: She is oriented to person, place, and time. She appears well-developed and well-nourished.  HENT:  Head: Normocephalic.  Cardiovascular: Normal rate.  Respiratory: Effort normal. No respiratory distress.  GI: There is abdominal tenderness.  Genitourinary:    Genitourinary Comments: Vaginal and cervical bleeding noted without clots. No purulent drainage. No CMT. Left adenxal tenderness and fullness.   Neurological: She is alert and oriented to person, place, and time.  MAU Course Urine pregnancy test to confirm at home positive result - positive CBC to assess for anemia which could indicate ectopic rupture - WNL CMP to assess kidney function in case MTX is needed to treat tubal pregnancy - WNL Quantitative HCG - 6,885 Transvaginal ultrasound to  confirm results from [facility] of no intrauterine pregnancy - No visualized intrauterine pregnancy or gestational sac. No adnexal abnormality or free fluid. UA to r/o UTI- Negative for infection. Elevated Hgb and RBC Wet prep to r/o infection - Positive for Trich GC/CT - results pending  Results for orders placed or performed during the hospital encounter of 04/11/18 (from the past 24 hour(s))  Urinalysis, Routine w reflex microscopic     Status: Abnormal   Collection Time: 04/11/18  4:45 PM  Result Value Ref Range   Color, Urine YELLOW YELLOW   APPearance HAZY (A) CLEAR   Specific Gravity, Urine 1.028 1.005 - 1.030   pH 5.0 5.0 - 8.0   Glucose, UA NEGATIVE NEGATIVE mg/dL   Hgb urine dipstick LARGE (A) NEGATIVE   Bilirubin Urine NEGATIVE NEGATIVE   Ketones, ur NEGATIVE NEGATIVE mg/dL   Protein, ur 30 (A) NEGATIVE mg/dL   Nitrite NEGATIVE NEGATIVE   Leukocytes, UA NEGATIVE NEGATIVE   RBC / HPF >50 (H) 0 - 5 RBC/hpf   WBC, UA 0-5 0 - 5 WBC/hpf   Bacteria, UA NONE SEEN NONE SEEN   Squamous Epithelial / LPF 0-5 0 - 5   Mucus PRESENT   Wet prep, genital     Status: Abnormal   Collection Time: 04/11/18  5:01 PM  Result Value Ref Range   Yeast Wet Prep HPF POC NONE SEEN NONE SEEN   Trich, Wet Prep PRESENT (A) NONE SEEN   Clue Cells Wet Prep HPF POC NONE SEEN NONE SEEN   WBC, Wet Prep HPF POC MODERATE (A) NONE SEEN   Sperm NONE SEEN   Pregnancy, urine POC     Status: Abnormal   Collection Time: 04/11/18  5:02 PM  Result Value Ref Range   Preg Test, Ur POSITIVE (A) NEGATIVE  CBC     Status: None   Collection Time: 04/11/18  5:08 PM  Result Value Ref Range   WBC 6.1 4.0 - 10.5 K/uL   RBC 5.10 3.87 - 5.11 MIL/uL   Hemoglobin 14.0 12.0 - 15.0 g/dL   HCT 15.4 00.8 - 67.6 %   MCV 82.0 80.0 - 100.0 fL   MCH 27.5 26.0 - 34.0 pg   MCHC 33.5 30.0 - 36.0 g/dL   RDW 19.5 09.3 - 26.7 %   Platelets 272 150 - 400 K/uL   nRBC 0.0 0.0 - 0.2 %  Comprehensive metabolic panel      Status: None   Collection Time: 04/11/18  5:08 PM  Result Value Ref Range   Sodium 136 135 - 145 mmol/L   Potassium 3.8 3.5 - 5.1 mmol/L   Chloride 103 98 - 111 mmol/L   CO2 25 22 - 32 mmol/L   Glucose, Bld 98 70 - 99 mg/dL   BUN 11 6 - 20 mg/dL   Creatinine, Ser 1.24 0.44 - 1.00 mg/dL   Calcium 9.4 8.9 - 58.0 mg/dL   Total Protein 7.5 6.5 - 8.1 g/dL   Albumin 4.5 3.5 - 5.0 g/dL   AST 17 15 - 41 U/L   ALT 25 0 - 44 U/L   Alkaline Phosphatase 66 38 - 126 U/L   Total Bilirubin 1.0 0.3 -  1.2 mg/dL   GFR calc non Af Amer >60 >60 mL/min   GFR calc Af Amer >60 >60 mL/min   Anion gap 8 5 - 15  hCG, quantitative, pregnancy     Status: Abnormal   Collection Time: 04/11/18  5:08 PM  Result Value Ref Range   hCG, Beta Chain, Quant, S 6,885 (H) <5 mIU/mL   [place] Ob Transvaginal  Result Date: 04/11/2018 CLINICAL DATA:  Vaginal bleeding, positive pregnancy test EXAM: TRANSVAGINAL OB ULTRASOUND TECHNIQUE: Transvaginal ultrasound was performed for complete evaluation of the gestation as well as the maternal uterus, adnexal regions, and pelvic cul-de-sac. COMPARISON:  10/27/2015 FINDINGS: Intrauterine gestational sac: Not visualized Yolk sac:  Not Visualized. Embryo:  Not Visualized. Cardiac Activity: Not Visualized. Heart Rate: None detected Subchorionic hemorrhage:  None visualized. Maternal uterus/adnexae: Ovaries appear normal. No pelvic free fluid. No visualized intrauterine pregnancy. IMPRESSION: No visualized intrauterine pregnancy or gestational sac. No adnexal abnormality or free fluid. Recommend follow-up quantitative B-HCG levels and follow-up US in 14 days to assess viability. This recommendation follows SRU consensus guidelines: Diagnostic Criteria for Nonviable Pregnancy Early in the First Trimester. Malva Limes Engl J Med 2013; 161:0960-45; 369:1443-51. Electronically Signed   By: Judie PetitM.  Shick M.D.   On: 04/11/2018 17:50      MDM No visualized IUP with elevated quant hCG 6,885-  Follow up with MAU in 3 days for  repeat quant hCG to ensure it is downtrending  Received strict bleeding precautions  Trichomoniasis - Start metronidazole 2 g  Shelly Hodges, Student PA  CNM attestation:  I have seen and examined this patient and agree with above documentation in the PA student's note.   Shelly Hodges is a 43 y.o. G3P2002 at 7444w0d reporting bleeding and pain throughout January.   PE: Patient Vitals for the past 24 hrs:  BP Temp Pulse Resp SpO2 Height Weight  04/11/18 1656 136/81 98.1 F (36.7 C) 73 20 100 % 5\' 3"  (1.6 m) 151 lb (68.5 kg)   Gen: calm comfortable, NAD Resp: normal effort, no distress Heart: Regular rate Abd: Soft, NT,   ROS, labs, PMH reviewed  Orders Placed This Encounter  Procedures  . Wet prep, genital  . US OB Transvaginal  . Urinalysis, Routine w reflex microscopic  . CBC  . Comprehensive metabolic panel  . hCG, quantitative, pregnancy  . hCG, quantitative, pregnancy  . Pregnancy, urine POC  . Discharge patient Discharge disposition: 01-Home or Self Care; Discharge patient date: 04/11/2018   Meds ordered this encounter  Medications  . metroNIDAZOLE (FLAGYL) tablet 2,000 mg    MDM -Patient positive for trich, will treat in MAU and given partner treatment as well -Given that beta is over 6,000 and no IUP on US, will treat as pregnancy of unknown anatomic location, although most likely patient was earlier than [redacted] weeks pregnant and has had a miscarriage  -case discussed with Dr. Despina HiddenEure, who agrees with plan of care.  1. Trichimoniasis   2. Vaginal bleeding   3. Pregnancy of unknown anatomic location     Assessment:  Plan: - Discharge home in stable condition. - Strict ectopic  precautions. -Return on Saturday morning for repeat stat beta -Partner tx for trich given.  - Return to maternity admissions symptoms worsen  Shelly Hodges,  Shelly Hodges, [facility] CenterCNM 04/11/2018 8:01 PM

## 2018-04-11 NOTE — MAU Note (Signed)
Pt reports she had a positive preg test [facility] last week and yesterday she had an u/s there yesterday and they "couldn't find the baby". Was told to follow up here.

## 2018-04-11 NOTE — Discharge Instructions (Signed)
-  Return to MAU on Saturday morning, January 25 for repeat blood work. Plan to be here about 2 hours.  -Return to MAU if you have heavy bleeding, an increase in abdominal pain, or start feeling lots of cramping, come back to MAU before Saturday.   Ectopic Pregnancy  An ectopic pregnancy happens when a fertilized egg grows outside the womb (uterus). The fertilized egg cannot stay alive outside of the womb. This problem often happens in a fallopian tube. It is often caused by damage to the tube. If this problem is found early, you may be treated with medicine that stops the egg from growing. If your tube tears or bursts open (ruptures), you will bleed inside. Often, there is very bad pain in the lower belly. This is an emergency. You will need surgery. Get help right away. Follow these instructions at home: After being treated with medicine or surgery:  Rest and limit your activity for as long as told by your doctor.  Until your doctor says that it is safe: ? Do not lift anything that is heavier than 10 lb (4.5 kg) or the limit that your doctor tells you. ? Avoid exercise and any movement that takes a lot of effort.  To prevent problems when pooping (constipation): ? Eat a healthy diet. This includes:  Fruits.  Vegetables.  Whole grains. ? Drink 6-8 glasses of water a day. Contact a doctor if: Get help right away if:  You have sudden and very bad pain in your belly.  You have very bad pain in your shoulders or neck.  You have pain that gets worse and is not helped by medicine.  You have: ? A fever or chills. ? Vaginal bleeding. ? Redness or swelling at the site of a surgical cut (incision).  You feel sick to your stomach (nauseous) or you throw up (vomit).  You feel dizzy or weak.  You feel light-headed or you pass out (faint). Summary  An ectopic pregnancy happens when a fertilized egg grows outside the womb (uterus).  If this problem is found early, you may be treated  with medicine that stops the egg from growing.  If your tube tears or bursts open (ruptures), you will need surgery. This is an emergency. Get help right away. This information is not intended to replace advice given to you by your health care provider. Make sure you discuss any questions you have with your health care provider. Document Released: 06/03/2008 Document Revised: 03/31/2016 Document Reviewed: 03/31/2016 Elsevier Interactive Patient Education  2019 [employer].

## 2018-04-12 LAB — GC/CHLAMYDIA PROBE AMP (~~LOC~~) NOT AT ARMC
Chlamydia: NEGATIVE
Neisseria Gonorrhea: NEGATIVE

## 2018-06-28 IMAGING — US US MFM OB FOLLOW-UP
1 series · 14 of 28 positions shown · non-contrast
Comparison: none

[Series 1: us mfm ob follow-up · 37 acquisitions, 14 frames shown]
[im 2/37]
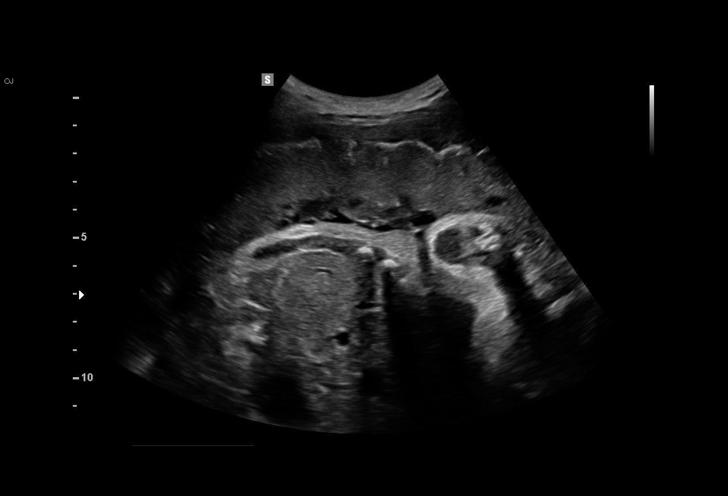
[im 5/37]
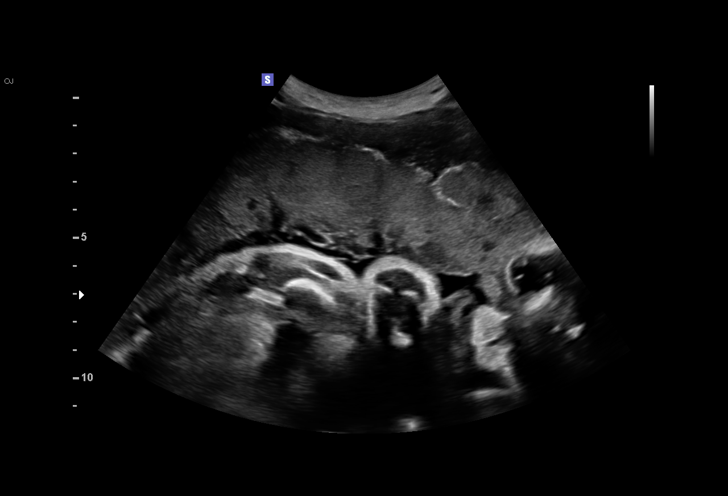
[im 7/37]
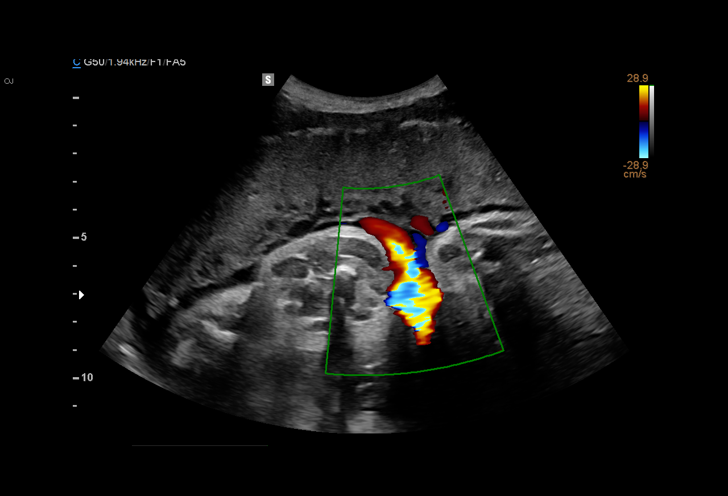
[im 10/37]
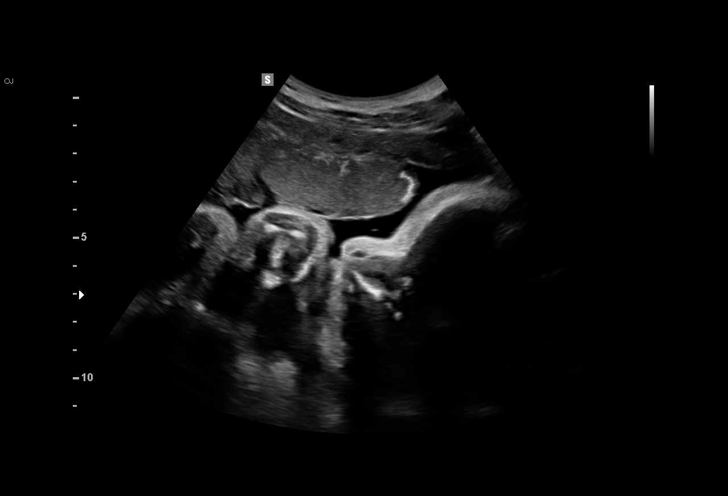
[im 13/37]
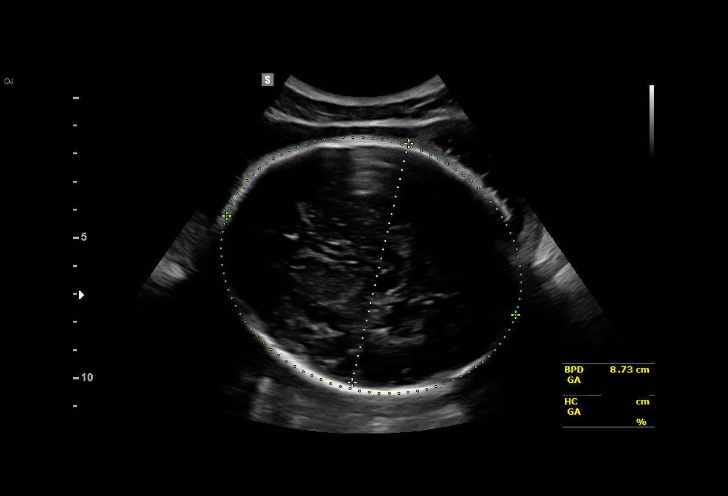
[im 15/37]
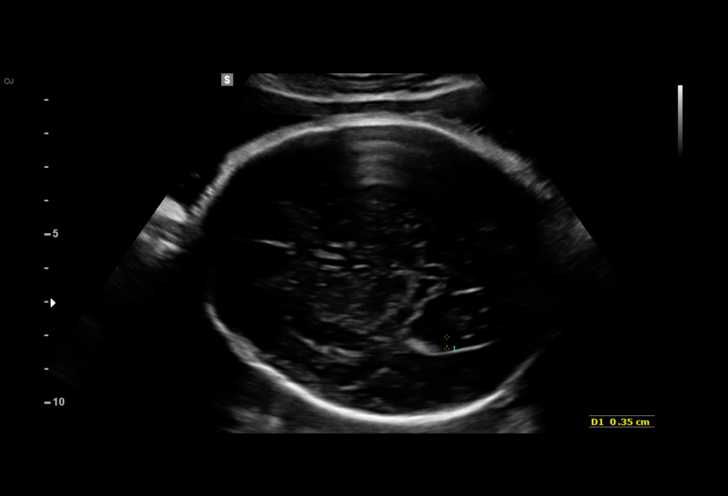
[im 18/37]
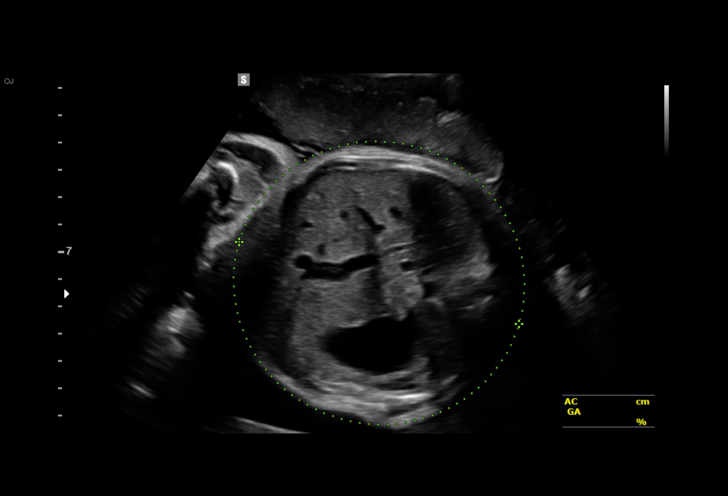
[im 21/37]
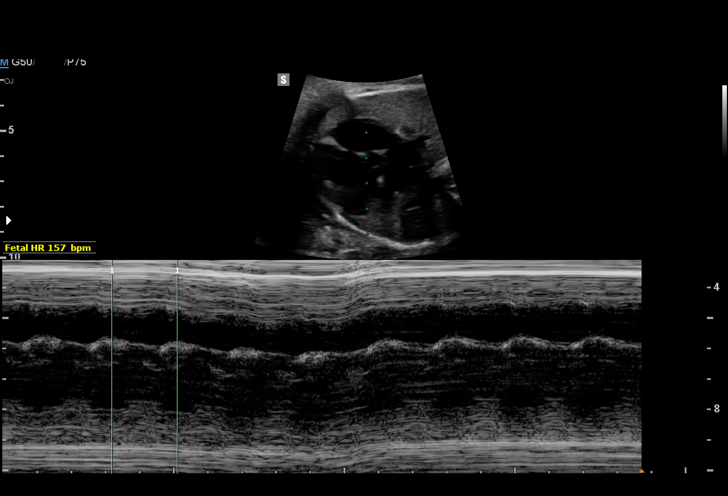
[im 23/37]
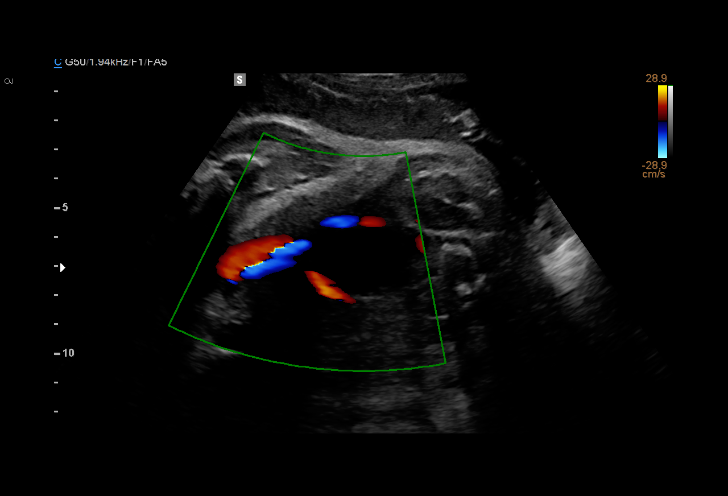
[im 26/37]
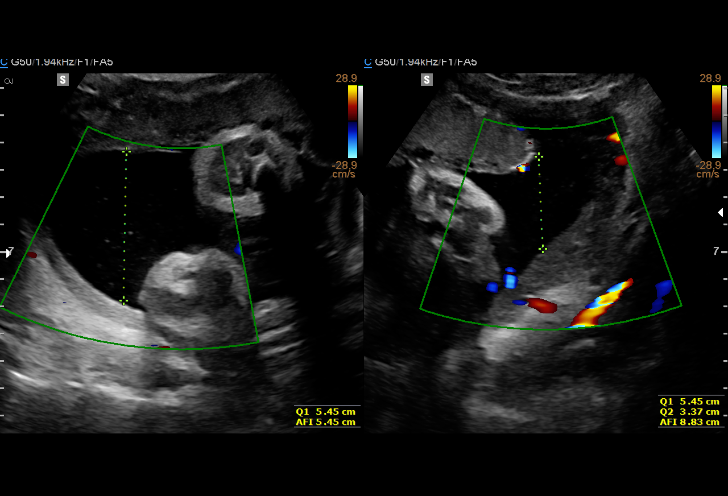
[im 29/37]
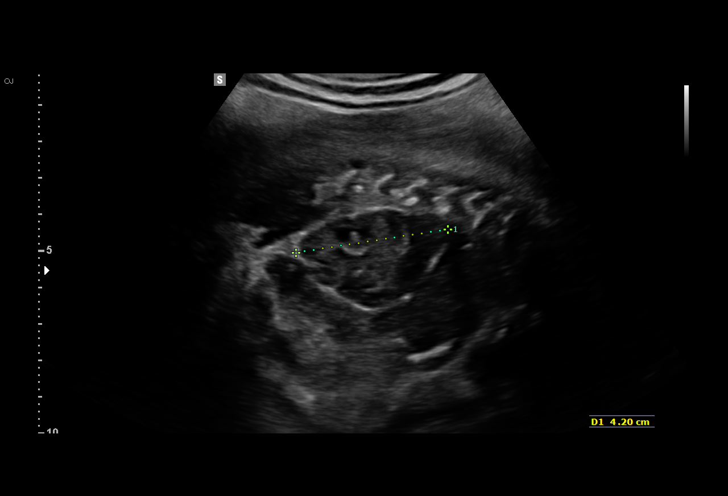
[im 31/37]
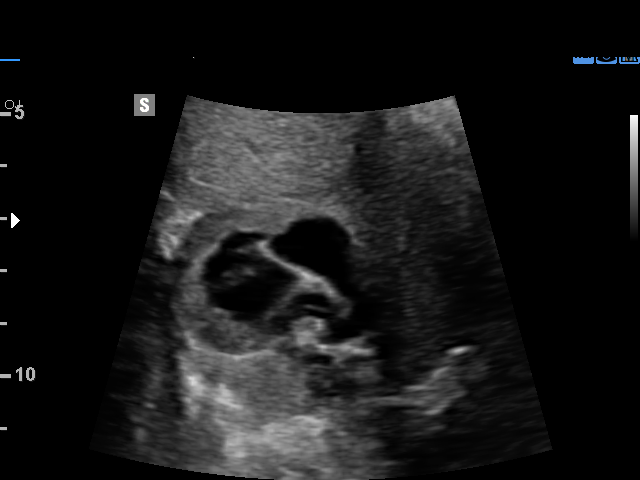
[im 34/37]
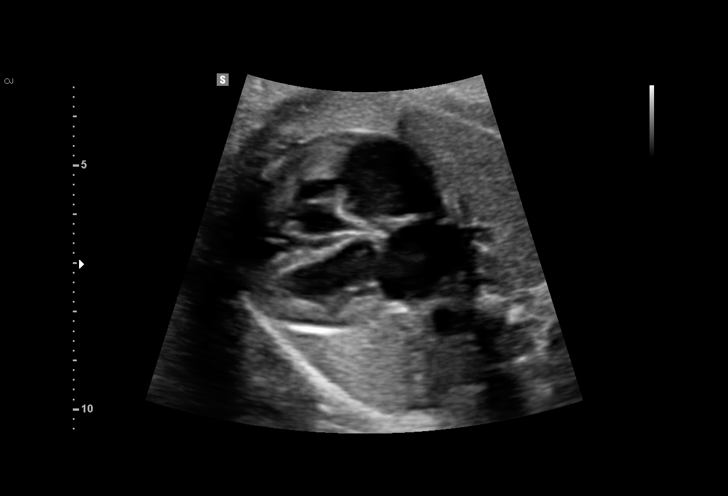
[im 37/37]
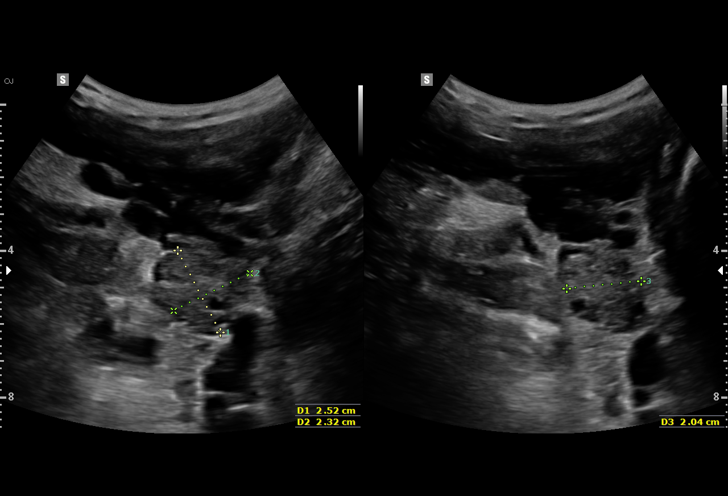

[14 of 28 positions shown; findings below may reference images not displayed]

1  RUDI JUMPER           [id number]      [id number]     [id number]
Indications

34 weeks gestation of pregnancy
Advanced maternal age multigravida 35+,
third trimester-declined further testing
Poor weight gain of pregnancy, third trimester
OB History

Blood Type:            Height:  5'4"   Weight (lb):  134.4    BMI:
Gravidity:    2         Term:   1
Living:       1
Fetal Evaluation

Num Of Fetuses:     1
Fetal Heart         157
Rate(bpm):
Cardiac Activity:   Observed
Presentation:       Cephalic
Placenta:           Anterior, above cervical os
P. Cord Insertion:  Visualized

Amniotic Fluid
AFI FV:      Subjectively within normal limits

AFI Sum(cm)     %Tile       Largest Pocket(cm)
12.09           35

RUQ(cm)                     LUQ(cm)        LLQ(cm)
5.45
Biometry

BPD:      87.4  mm     G. Age:  35w 2d         68  %    CI:        78.46   %   70 - 86
FL/HC:      20.1   %   20.1 -
HC:      312.1  mm     G. Age:  34w 6d         22  %    HC/AC:      0.95       0.93 -
AC:      328.7  mm     G. Age:  36w 5d         95  %    FL/BPD:     71.6   %   71 - 87
FL:       62.6  mm     G. Age:  32w 3d          3  %    FL/AC:      19.0   %   20 - 24
HUM:      53.9  mm     G. Age:  31w 2d        < 5  %

Est. FW:    3792  gm    5 lb 13 oz      74  %
Gestational Age

LMP:           34w 5d       Date:   02/26/15                 EDD:   12/03/15
U/S Today:     34w 6d                                        EDD:   12/02/15
Best:          34w 5d    Det. By:   LMP  (02/26/15)          EDD:   12/03/15
Anatomy

Cranium:               Appears normal         LVOT:                   Previously seen
Cavum:                 Appears normal         Aortic Arch:            Previously seen
Ventricles:            Appears normal         Ductal Arch:            Previously seen
Choroid Plexus:        Previously seen        Diaphragm:              Previously seen
Cerebellum:            Previously seen        Stomach:                Previously Seen
Posterior Fossa:       Previously seen        Abdomen:                Previously seen
Nuchal Fold:           Not applicable (>20    Abdominal Wall:         Previously seen
wks GA)
Face:                  Orbits and profile     Cord Vessels:           Appears normal (3
previously seen                                vessel cord)
Lips:                  Previously seen        Kidneys:                Appear normal
Palate:                Previously seen        Bladder:                Appears normal
Thoracic:              Appears normal         Spine:                  Previously seen
Heart:                 Appears normal         Upper Extremities:      Previously seen
(4CH, axis, and situs
RVOT:                  Appears normal         Lower Extremities:      Previously seen

Other:  Fetus appears to be a male. Heels and 5th digit  prev. visualized.
Cervix Uterus Adnexa

Cervix
Normal appearance by transabdominal scan.

Uterus
No abnormality visualized.

Left Ovary
Within normal limits.

Right Ovary
Within normal limits.
Impression

Single IUP at 34w 5d
Poor weight gain during pregnancy
Normal interval anatomy
Fetal growth is appropriate (74th %tile)
Anterior placenta without previa
Normal amniotic fluid volume
Recommendations

Follow-up ultrasounds as clinically indicated.

## 2018-09-25 ENCOUNTER — Encounter: Payer: Self-pay | Admitting: *Deleted

## 2019-01-23 ENCOUNTER — Encounter (HOSPITAL_COMMUNITY): Payer: Self-pay

## 2019-04-19 ENCOUNTER — Ambulatory Visit (HOSPITAL_COMMUNITY)
Admission: EM | Admit: 2019-04-19 | Discharge: 2019-04-19 | Disposition: A | Discharge status: Home / Self Care | Payer: Medicaid Other | Attending: Urgent Care | Admitting: Urgent Care

## 2019-04-19 ENCOUNTER — Other Ambulatory Visit: Payer: Self-pay

## 2019-04-19 ENCOUNTER — Encounter (HOSPITAL_COMMUNITY): Payer: Self-pay | Admitting: Emergency Medicine

## 2019-04-19 DIAGNOSIS — K047 Periapical abscess without sinus: Secondary | ICD-10-CM | POA: Diagnosis not present

## 2019-04-19 DIAGNOSIS — K0889 Other specified disorders of teeth and supporting structures: Secondary | ICD-10-CM | POA: Diagnosis not present

## 2019-04-19 DIAGNOSIS — R220 Localized swelling, mass and lump, head: Secondary | ICD-10-CM

## 2019-04-19 DIAGNOSIS — K029 Dental caries, unspecified: Secondary | ICD-10-CM

## 2019-04-19 MED ORDER — AMOXICILLIN-POT CLAVULANATE 875-125 MG PO TABS: 1.0000 | ORAL_TABLET | Freq: Two times a day (BID) | ORAL | 0 refills | Status: DC

## 2019-04-19 MED ORDER — ONDANSETRON 8 MG PO TBDP: 8.0000 mg | ORAL_TABLET | Freq: Three times a day (TID) | ORAL | 0 refills | Status: DC | PRN

## 2019-04-19 MED ORDER — CEFTRIAXONE SODIUM 1 G IJ SOLR
1.0000 g | Freq: Once | INTRAMUSCULAR | Status: AC
  Administered 2019-04-19: 1 g via INTRAMUSCULAR

## 2019-04-19 MED ORDER — METHYLPREDNISOLONE ACETATE 80 MG/ML IJ SUSP
80.0000 mg | Freq: Once | INTRAMUSCULAR | Status: AC
  Administered 2019-04-19: 13:00:00 80 mg via INTRAMUSCULAR

## 2019-04-19 MED ORDER — METHYLPREDNISOLONE ACETATE 80 MG/ML IJ SUSP
INTRAMUSCULAR | Status: AC
  Filled 2019-04-19: qty 1

## 2019-04-19 MED ORDER — CEFTRIAXONE SODIUM 1 G IJ SOLR
INTRAMUSCULAR | Status: AC
  Filled 2019-04-19: qty 10

## 2019-04-19 NOTE — ED Provider Notes (Signed)
[facility]   MRN: [id number] DOB: 07-24-1975  Subjective:   Shelly Hodges is a 44 y.o. female presenting for 5-day history of acute onset worsening severe right facial pain with associated swelling and dental pain.  Patient became significantly concerned today due to the swelling of her right lower lip.  He does not get regular dental care, has not tried medications for relief.  She states that today she had nausea and difficulty opening her mouth.  Denies taking chronic medications.  No Known Allergies  Past Medical History:  Diagnosis Date  . Medical history non-contributory      Past Surgical History:  Procedure Laterality Date  . NO PAST SURGERIES      Family History  Problem Relation Age of Onset  . Sickle cell trait Maternal Aunt   . Sickle cell trait Maternal Aunt     Social History   Tobacco Use  . Smoking status: Former Games developer  . Smokeless tobacco: Never Used  Substance Use Topics  . Alcohol use: Yes  . Drug use: Yes    Types: Marijuana    ROS Denies fever, inability to swallow, chest pain, vomiting, abdominal pain, difficulty controlling secretions.  Objective:   Vitals: BP (!) 143/89 (BP Location: Right Arm)   Pulse 85   Temp 98.7 F (37.1 C) (Oral)   Resp 18   LMP 03/26/2019 (Approximate)   SpO2 100%   Breastfeeding No   Physical Exam Constitutional:      General: She is not in acute distress.    Appearance: Normal appearance. She is well-developed. She is not ill-appearing.  HENT:     Head: Normocephalic and atraumatic.      Nose: Nose normal.     Mouth/Throat:     Mouth: Mucous membranes are moist.     Pharynx: Oropharynx is clear.   Eyes:     General: No scleral icterus.    Extraocular Movements: Extraocular movements intact.     Pupils: Pupils are equal, round, and reactive to light.  Cardiovascular:     Rate and Rhythm: Normal rate.  Pulmonary:     Effort: Pulmonary effort is normal.  Musculoskeletal:   Cervical back: Normal range of motion and neck supple. No rigidity or tenderness.  Lymphadenopathy:     Cervical: No cervical adenopathy.  Skin:    General: Skin is warm and dry.  Neurological:     General: No focal deficit present.     Mental Status: She is alert and oriented to person, place, and time.  Psychiatric:        Mood and Affect: Mood normal.        Behavior: Behavior normal.        Thought Content: Thought content normal.        Judgment: Judgment normal.      Assessment and Plan :   1. Pain, dental   2. Dental abscess   3. Facial swelling   4. Lip swelling   5. Dental caries     Patient is able to open mouth, is controlling secretions and speaking in full sentences.  Vital signs are stable.  Will use IM ceftriaxone, Depo-Medrol for urgent intervention of worsening dental abscess.  Patient is a schedule Tylenol for pain, use Zofran for nausea.  Emphasized need to contact dental surgeon as soon as possible, information provided to the patient.  Will maintain strict ER precautions, if no improvement by tomorrow morning or significant worsening tonight then she is to report  to the emergency room immediately. Counseled patient on potential for adverse effects with medications prescribed today, patient verbalized understanding.    Jaynee Eagles, PA-C 04/19/19 1250

## 2019-04-19 NOTE — ED Triage Notes (Signed)
Pt reports right lower gum pain that started on Monday night.  She states she had a lot of difficulty opening her mouth yesterday.  This morning she woke up with facial swelling and her right bottom lip swollen as well.

## 2019-04-19 NOTE — Discharge Instructions (Addendum)
You may take 500mg -650mg  every 6 hours for pain and fevers.    [facility] [phone] extension [phone]  Dr[address] [phone] [address]  [place] [phone] [address] [facility] [address].  [facility] [phone] extension [phone], [place], [address], [place] First come first serve for the first 10 clients.  May do simple extractions only, no wisdom teeth or surgery.  You may try the second for Thursday of the month starting at 6:30 AM.  [facility] You may call the school to see if they are still helping to provide dental care for emergent cases.

## 2020-01-07 ENCOUNTER — Other Ambulatory Visit: Payer: Self-pay

## 2020-01-07 ENCOUNTER — Ambulatory Visit (HOSPITAL_COMMUNITY)
Admission: EM | Admit: 2020-01-07 | Discharge: 2020-01-07 | Disposition: A | Discharge status: Home / Self Care | Payer: Medicaid Other | Attending: Family Medicine | Admitting: Family Medicine

## 2020-01-07 ENCOUNTER — Encounter (HOSPITAL_COMMUNITY): Payer: Self-pay | Admitting: *Deleted

## 2020-01-07 DIAGNOSIS — Z3202 Encounter for pregnancy test, result negative: Secondary | ICD-10-CM | POA: Diagnosis not present

## 2020-01-07 DIAGNOSIS — R112 Nausea with vomiting, unspecified: Secondary | ICD-10-CM

## 2020-01-07 DIAGNOSIS — Z20822 Contact with and (suspected) exposure to covid-19: Secondary | ICD-10-CM | POA: Insufficient documentation

## 2020-01-07 DIAGNOSIS — Z87891 Personal history of nicotine dependence: Secondary | ICD-10-CM | POA: Diagnosis not present

## 2020-01-07 DIAGNOSIS — R1110 Vomiting, unspecified: Secondary | ICD-10-CM

## 2020-01-07 DIAGNOSIS — R509 Fever, unspecified: Secondary | ICD-10-CM

## 2020-01-07 DIAGNOSIS — Z3201 Encounter for pregnancy test, result positive: Secondary | ICD-10-CM

## 2020-01-07 LAB — POCT URINALYSIS DIPSTICK, ED / UC
Glucose, UA: NEGATIVE mg/dL
Ketones, ur: 40 mg/dL — AB
Leukocytes,Ua: NEGATIVE
Nitrite: NEGATIVE
Protein, ur: NEGATIVE mg/dL
Specific Gravity, Urine: >=1.03 (ref 1.005–1.030)
Urobilinogen, UA: 0.2 mg/dL (ref 0.0–1.0)
pH: 5.5 (ref 5.0–8.0)

## 2020-01-07 LAB — RESP PANEL BY RT PCR (RSV, FLU A&B, COVID)
Influenza A by PCR: NEGATIVE
Influenza B by PCR: NEGATIVE
Respiratory Syncytial Virus by PCR: NEGATIVE
SARS Coronavirus 2 by RT PCR: NEGATIVE

## 2020-01-07 LAB — POC URINE PREG, ED: Preg Test, Ur: POSITIVE — AB

## 2020-01-07 MED ORDER — DOXYLAMINE-PYRIDOXINE 10-10 MG PO TBEC: 1.0000 | DELAYED_RELEASE_TABLET | Freq: Two times a day (BID) | ORAL | 0 refills | Status: DC | PRN

## 2020-01-07 NOTE — ED Triage Notes (Signed)
C/O nausea & vomiting over past [redacted] wks along with a missed period.  Able to keep down PO fluids. Over past week c/o body aches, HA, chills, feeling feverish.  Denies any cough, congestion or runny nose.

## 2020-01-07 NOTE — ED Provider Notes (Signed)
[facility]    CSN: [id number] Arrival date & time: 01/07/20  1448      History   Chief Complaint Chief Complaint  Patient presents with  . Fever  . Emesis    HPI Shelly Hodges is a 44 y.o. female.   Here today complaining of about 2 week history of nausea, vomiting, fatigue and now 2 days of chills, body aches, N/V worsening. Denies cough, congestion, sore throat, CP, SOB, dysuria, hematuria, vaginal sxs or exposures to STIs. Has not been trying anything OTC for sxs. Of note, she states she is about 2- 4 weeks late for her period. No sick contacts recently, UTD on COVID vaccines.      History reviewed. No pertinent past medical history.  Patient Active Problem List   Diagnosis Date Noted  . SVD (spontaneous vaginal delivery) 12/03/2015  . Poor weight gain of pregnancy 10/12/2015  . Abnormal hemoglobin (HCC) 09/23/2015  . Anemia 09/08/2015  . Marijuana use 08/19/2015  . Antepartum multigravida of advanced maternal age 72/22/2017  . Pregnancy of unknown anatomic location 08/10/2015    Past Surgical History:  Procedure Laterality Date  . NO PAST SURGERIES      OB History    Gravida  3   Para  2   Term  2   Preterm      AB      Living  2     SAB      TAB      Ectopic      Multiple  0   Live Births  2            Home Medications    Prior to Admission medications   Medication Sig Start Date End Date Taking? Authorizing Provider  amoxicillin-clavulanate (AUGMENTIN) 875-125 MG tablet Take 1 tablet by mouth every 12 (twelve) hours. 04/19/19   Wallis Bamberg, PA-C  Doxylamine-Pyridoxine 10-10 MG TBEC Take 1 tablet by mouth 2 (two) times daily as needed. 01/07/20   Particia Nearing, PA-C  ondansetron (ZOFRAN-ODT) 8 MG disintegrating tablet Take 1 tablet (8 mg total) by mouth every 8 (eight) hours as needed for nausea or vomiting. 04/19/19   Wallis Bamberg, PA-C    Family History Family History  Problem Relation Age of Onset  .  Sickle cell trait Maternal Aunt   . Sickle cell trait Maternal Aunt   . Diabetes Mother   . Renal Disease Mother     Social History Social History   Tobacco Use  . Smoking status: Former Games developer  . Smokeless tobacco: Never Used  Vaping Use  . Vaping Use: Never used  Substance Use Topics  . Alcohol use: Not Currently    Comment: none x 2 months as of 01/07/20  . Drug use: Not Currently    Types: Marijuana     Allergies   Patient has no known allergies.   Review of Systems Review of Systems PER HPI   Physical Exam Triage Vital Signs ED Triage Vitals [01/07/20 1557]  Enc Vitals Group     BP 138/80     Pulse Rate 72     Resp 16     Temp 98.4 F (36.9 C)     Temp Source Oral     SpO2 100 %     Weight      Height      Head Circumference      Peak Flow      Pain Score 3  Pain Loc      Pain Edu?      Excl. in GC?    No data found.  Updated Vital Signs BP 138/80   Pulse 72   Temp 98.4 F (36.9 C) (Oral)   Resp 16   LMP 11/13/2019 (Approximate)   SpO2 100%   Visual Acuity Right Eye Distance:   Left Eye Distance:   Bilateral Distance:    Right Eye Near:   Left Eye Near:    Bilateral Near:     Physical Exam Vitals and nursing note reviewed.  Constitutional:      Appearance: Normal appearance. She is not ill-appearing.  HENT:     Head: Atraumatic.     Right Ear: Tympanic membrane normal.     Left Ear: Tympanic membrane normal.     Nose: Nose normal.     Mouth/Throat:     Mouth: Mucous membranes are moist.     Pharynx: Oropharynx is clear.  Eyes:     Extraocular Movements: Extraocular movements intact.     Conjunctiva/sclera: Conjunctivae normal.  Cardiovascular:     Rate and Rhythm: Normal rate and regular rhythm.     Heart sounds: Normal heart sounds.  Pulmonary:     Effort: Pulmonary effort is normal.     Breath sounds: Normal breath sounds. No wheezing or rales.  Abdominal:     General: Bowel sounds are normal. There is no  distension.     Palpations: Abdomen is soft.     Tenderness: There is abdominal tenderness (minimal lower abdominal ttp). There is no right CVA tenderness, left CVA tenderness or guarding.  Musculoskeletal:        General: Normal range of motion.     Cervical back: Normal range of motion and neck supple.  Skin:    General: Skin is warm and dry.  Neurological:     Mental Status: She is alert and oriented to person, place, and time.  Psychiatric:        Mood and Affect: Mood normal.        Thought Content: Thought content normal.        Judgment: Judgment normal.      UC Treatments / Results  Labs (all labs ordered are listed, but only abnormal results are displayed) Labs Reviewed  POCT URINALYSIS DIPSTICK, ED / UC - Abnormal; Notable for the following components:      Result Value   Bilirubin Urine SMALL (*)    Ketones, ur 40 (*)    Hgb urine dipstick TRACE (*)    All other components within normal limits  POC URINE PREG, ED - Abnormal; Notable for the following components:   Preg Test, Ur POSITIVE (*)    All other components within normal limits  RESP PANEL BY RT PCR (RSV, FLU A&B, COVID)    EKG   Radiology No results found.  Procedures Procedures (including critical care time)  Medications Ordered in UC Medications - No data to display  Initial Impression / Assessment and Plan / UC Course  I have reviewed the triage vital signs and the nursing notes.  Pertinent labs & imaging results that were available during my care of the patient were reviewed by me and considered in my medical decision making (see chart for details).     COVID test pending, U/A without evidence of UTI, urine preg positive. Declines vaginal swab or STI testing today. Discussed these findings with patient and will send in diclegis for pregnancy safe nausea  medication. Suspect her sxs coming from this but cannot r/o additional viral infection. Monitor closely for resolution, return precautions  reviewed. F/u with OBGYN as soon as able for initial prenatal visit.   Final Clinical Impressions(s) / UC Diagnoses   Final diagnoses:  Non-intractable vomiting with nausea, unspecified vomiting type  Positive pregnancy test   Discharge Instructions   None    ED Prescriptions    Medication Sig Dispense Auth. Provider   Doxylamine-Pyridoxine 10-10 MG TBEC Take 1 tablet by mouth 2 (two) times daily as needed. 60 tablet Particia Nearing, [place]     PDMP not reviewed this encounter.   Particia Nearing, [place] 01/07/20 1802

## 2020-01-30 ENCOUNTER — Inpatient Hospital Stay (HOSPITAL_COMMUNITY): Payer: Medicaid Other

## 2020-01-30 ENCOUNTER — Encounter (HOSPITAL_COMMUNITY): Payer: Self-pay | Admitting: Obstetrics and Gynecology

## 2020-01-30 ENCOUNTER — Inpatient Hospital Stay (HOSPITAL_COMMUNITY)
Admission: AD | Admit: 2020-01-30 | Discharge: 2020-01-30 | Disposition: A | Discharge status: Home / Self Care | Payer: Medicaid Other | Attending: Obstetrics and Gynecology | Admitting: Obstetrics and Gynecology

## 2020-01-30 ENCOUNTER — Other Ambulatory Visit: Payer: Self-pay

## 2020-01-30 DIAGNOSIS — Z3A11 11 weeks gestation of pregnancy: Secondary | ICD-10-CM | POA: Diagnosis not present

## 2020-01-30 DIAGNOSIS — O209 Hemorrhage in early pregnancy, unspecified: Secondary | ICD-10-CM | POA: Diagnosis present

## 2020-01-30 DIAGNOSIS — Z6790 Unspecified blood type, Rh positive: Secondary | ICD-10-CM | POA: Diagnosis not present

## 2020-01-30 DIAGNOSIS — O4690 Antepartum hemorrhage, unspecified, unspecified trimester: Secondary | ICD-10-CM

## 2020-01-30 DIAGNOSIS — O4691 Antepartum hemorrhage, unspecified, first trimester: Secondary | ICD-10-CM | POA: Diagnosis not present

## 2020-01-30 DIAGNOSIS — O3680X0 Pregnancy with inconclusive fetal viability, not applicable or unspecified: Secondary | ICD-10-CM | POA: Diagnosis not present

## 2020-01-30 LAB — COMPREHENSIVE METABOLIC PANEL
ALT: 14 U/L (ref 0–44)
AST: 14 U/L — ABNORMAL LOW (ref 15–41)
Albumin: 3.6 g/dL (ref 3.5–5.0)
Alkaline Phosphatase: 59 U/L (ref 38–126)
Anion gap: 7 (ref 5–15)
BUN: 12 mg/dL (ref 6–20)
CO2: 24 mmol/L (ref 22–32)
Calcium: 9.2 mg/dL (ref 8.9–10.3)
Chloride: 106 mmol/L (ref 98–111)
Creatinine, Ser: 0.81 mg/dL (ref 0.44–1.00)
GFR, Estimated: >60 mL/min (ref >60)
Glucose, Bld: 102 mg/dL — ABNORMAL HIGH (ref 70–99)
Potassium: 4.4 mmol/L (ref 3.5–5.1)
Sodium: 137 mmol/L (ref 135–145)
Total Bilirubin: 0.4 mg/dL (ref 0.3–1.2)
Total Protein: 6.4 g/dL — ABNORMAL LOW (ref 6.5–8.1)

## 2020-01-30 LAB — URINALYSIS, ROUTINE W REFLEX MICROSCOPIC
Bilirubin Urine: NEGATIVE
Glucose, UA: NEGATIVE mg/dL
Ketones, ur: NEGATIVE mg/dL
Leukocytes,Ua: NEGATIVE
Nitrite: NEGATIVE
Protein, ur: NEGATIVE mg/dL
Specific Gravity, Urine: 1.024 (ref 1.005–1.030)
pH: 6 (ref 5.0–8.0)

## 2020-01-30 LAB — HCG, QUANTITATIVE, PREGNANCY: hCG, Beta Chain, Quant, S: 27 m[IU]/mL — ABNORMAL HIGH (ref <5)

## 2020-01-30 LAB — CBC
HCT: 33.5 % — ABNORMAL LOW (ref 36.0–46.0)
Hemoglobin: 10.8 g/dL — ABNORMAL LOW (ref 12.0–15.0)
MCH: 26.5 pg (ref 26.0–34.0)
MCHC: 32.2 g/dL (ref 30.0–36.0)
MCV: 82.1 fL (ref 80.0–100.0)
Platelets: 283 10*3/uL (ref 150–400)
RBC: 4.08 MIL/uL (ref 3.87–5.11)
RDW: 14.6 % (ref 11.5–15.5)
WBC: 5.4 10*3/uL (ref 4.0–10.5)
nRBC: 0 % (ref 0.0–0.2)

## 2020-01-30 LAB — WET PREP, GENITAL
Clue Cells Wet Prep HPF POC: NONE SEEN
Sperm: NONE SEEN
Yeast Wet Prep HPF POC: NONE SEEN

## 2020-01-30 MED ORDER — METRONIDAZOLE 500 MG PO TABS
2000.0000 mg | ORAL_TABLET | Freq: Once | ORAL | Status: AC
  Administered 2020-01-30: 2000 mg via ORAL
  Filled 2020-01-30: qty 4

## 2020-01-30 NOTE — MAU Note (Signed)
Presents with c/o VB, states having spotting for several days and has passed several small blood clots.  Denies abdominal pain/cramping.  LMP 11/13/2019

## 2020-01-30 NOTE — MAU Provider Note (Signed)
History     CSN: [id number]  Arrival date and time: 01/30/20 1239   First Provider Initiated Contact with Patient 01/30/20 1600      Chief Complaint  Patient presents with   Vaginal Bleeding   [name] is a 44 y.o. [id number] at [redacted]w[redacted]d who presents to MAU for vaginal bleeding which began 3 days ago with spotting and passing small clots. Patient denies abdominal and pelvic pain.  Passing blood clots? small Blood soaking clothes? no Lightheaded/dizzy? no Significant pelvic pain or cramping? no Passed any tissue? no  Current pregnancy problems? Pt has not yet been seen Blood Type? O Positive Allergies? NKDA Current medications? none Current PNC & next appt? Pt requests list of OB Providers  Pt denies vaginal discharge/odor/itching. Pt denies N/V, abdominal pain, constipation, diarrhea, or urinary problems. Pt denies fever, chills, fatigue, sweating or changes in appetite. Pt denies SOB or chest pain. Pt denies dizziness, HA, light-headedness, weakness.   OB History    Gravida  4   Para  2   Term  2   Preterm      AB      Living  2     SAB      TAB      Ectopic      Multiple  0   Live Births  2           No past medical history on file.  Past Surgical History:  Procedure Laterality Date   NO PAST SURGERIES      Family History  Problem Relation Age of Onset   Sickle cell trait Maternal Aunt    Sickle cell trait Maternal Aunt    Diabetes Mother    Renal Disease Mother     Social History   Tobacco Use   Smoking status: Former Smoker   Smokeless tobacco: Never Used  Building services engineer Use: Never used  Substance Use Topics   Alcohol use: Not Currently    Comment: none x 2 months as of 01/07/20   Drug use: Not Currently    Types: Marijuana    Allergies: No Known Allergies  Medications Prior to Admission  Medication Sig Dispense Refill Last Dose   amoxicillin-clavulanate (AUGMENTIN) 875-125 MG tablet Take 1  tablet by mouth every 12 (twelve) hours. 14 tablet 0    Doxylamine-Pyridoxine 10-10 MG TBEC Take 1 tablet by mouth 2 (two) times daily as needed. 60 tablet 0    ondansetron (ZOFRAN-ODT) 8 MG disintegrating tablet Take 1 tablet (8 mg total) by mouth every 8 (eight) hours as needed for nausea or vomiting. 20 tablet 0     Review of Systems  Constitutional: Negative for chills, diaphoresis, fatigue and fever.  Eyes: Negative for visual disturbance.  Respiratory: Negative for shortness of breath.   Cardiovascular: Negative for chest pain.  Gastrointestinal: Negative for abdominal pain, constipation, diarrhea, nausea and vomiting.  Genitourinary: Positive for vaginal bleeding. Negative for dysuria, flank pain, frequency, pelvic pain, urgency and vaginal discharge.  Neurological: Negative for dizziness, weakness, light-headedness and headaches.   Physical Exam   Blood pressure 139/87, pulse 83, temperature 98.2 F (36.8 C), temperature source Oral, resp. rate 18, height 5\' 4"  (1.626 m), weight 72.8 kg, last menstrual period 11/13/2019, SpO2 100 %.  Patient Vitals for the past 24 hrs:  BP Temp Temp src Pulse Resp SpO2 Height Weight  01/30/20 1613 139/87 -- -- 83 18 -- -- --  01/30/20 1318 136/87 98.2 F (36.8  C) Oral 82 18 100 % -- --  01/30/20 1314 -- -- -- -- -- -- 5\' 4"  (1.626 m) 72.8 kg   Physical Exam Vitals and nursing note reviewed.  Constitutional:      General: She is not in acute distress.    Appearance: Normal appearance. She is not ill-appearing, toxic-appearing or diaphoretic.  HENT:     Head: Normocephalic and atraumatic.  Pulmonary:     Effort: Pulmonary effort is normal.  Neurological:     Mental Status: She is alert and oriented to person, place, and time.  Psychiatric:        Mood and Affect: Mood normal.        Behavior: Behavior normal.        Thought Content: Thought content normal.        Judgment: Judgment normal.    Results for orders placed or performed  during the hospital encounter of 01/30/20 (from the past 24 hour(s))  Urinalysis, Routine w reflex microscopic Urine, Clean Catch     Status: Abnormal   Collection Time: 01/30/20  1:40 PM  Result Value Ref Range   Color, Urine YELLOW YELLOW   APPearance CLEAR CLEAR   Specific Gravity, Urine 1.024 1.005 - 1.030   pH 6.0 5.0 - 8.0   Glucose, UA NEGATIVE NEGATIVE mg/dL   Hgb urine dipstick SMALL (A) NEGATIVE   Bilirubin Urine NEGATIVE NEGATIVE   Ketones, ur NEGATIVE NEGATIVE mg/dL   Protein, ur NEGATIVE NEGATIVE mg/dL   Nitrite NEGATIVE NEGATIVE   Leukocytes,Ua NEGATIVE NEGATIVE   RBC / HPF 6-10 0 - 5 RBC/hpf   WBC, UA 0-5 0 - 5 WBC/hpf   Bacteria, UA RARE (A) NONE SEEN   Squamous Epithelial / LPF 0-5 0 - 5   Mucus PRESENT   CBC     Status: Abnormal   Collection Time: 01/30/20  2:38 PM  Result Value Ref Range   WBC 5.4 4.0 - 10.5 K/uL   RBC 4.08 3.87 - 5.11 MIL/uL   Hemoglobin 10.8 (L) 12.0 - 15.0 g/dL   HCT 13/11/21 (L) 36 - 46 %   MCV 82.1 80.0 - 100.0 fL   MCH 26.5 26.0 - 34.0 pg   MCHC 32.2 30.0 - 36.0 g/dL   RDW 75.9 16.3 - 84.6 %   Platelets 283 150 - 400 K/uL   nRBC 0.0 0.0 - 0.2 %  Comprehensive metabolic panel     Status: Abnormal   Collection Time: 01/30/20  2:38 PM  Result Value Ref Range   Sodium 137 135 - 145 mmol/L   Potassium 4.4 3.5 - 5.1 mmol/L   Chloride 106 98 - 111 mmol/L   CO2 24 22 - 32 mmol/L   Glucose, Bld 102 (H) 70 - 99 mg/dL   BUN 12 6 - 20 mg/dL   Creatinine, Ser 13/11/21 0.44 - 1.00 mg/dL   Calcium 9.2 8.9 - 9.35 mg/dL   Total Protein 6.4 (L) 6.5 - 8.1 g/dL   Albumin 3.6 3.5 - 5.0 g/dL   AST 14 (L) 15 - 41 U/L   ALT 14 0 - 44 U/L   Alkaline Phosphatase 59 38 - 126 U/L   Total Bilirubin 0.4 0.3 - 1.2 mg/dL   GFR, Estimated 70.1 >77 mL/min   Anion gap 7 5 - 15  hCG, quantitative, pregnancy     Status: Abnormal   Collection Time: 01/30/20  2:38 PM  Result Value Ref Range   hCG, Beta Chain, Quant, S 27 (H) <  5 mIU/mL  Wet prep, genital     Status:  Abnormal   Collection Time: 01/30/20  3:01 PM   Specimen: PATH Cytology Cervicovaginal Ancillary Only  Result Value Ref Range   Yeast Wet Prep HPF POC NONE SEEN NONE SEEN   Trich, Wet Prep PRESENT (A) NONE SEEN   Clue Cells Wet Prep HPF POC NONE SEEN NONE SEEN   WBC, Wet Prep HPF POC MANY (A) NONE SEEN   Sperm NONE SEEN    US OB LESS THAN 14 WEEKS WITH OB TRANSVAGINAL  Result Date: 01/30/2020 CLINICAL DATA:  Vaginal bleeding for several days EXAM: OBSTETRIC <14 WK [place] AND TRANSVAGINAL OB US TECHNIQUE: Both transabdominal and transvaginal ultrasound examinations were performed for complete evaluation of the gestation as well as the maternal uterus, adnexal regions, and pelvic cul-de-sac. Transvaginal technique was performed to assess early pregnancy. COMPARISON:  None. FINDINGS: Intrauterine gestational sac: None Yolk sac:  Not Visualized. Embryo:  Not Visualized. Cardiac Activity: Not Visualized. Maternal uterus/adnexae: Uterus is anteverted. Endometrium measures 9 mm and is unremarkable. Right ovary measures 2.6 x 2.8 x 1.7 cm and the left ovary measures 1.9 x 2.4 x 2.1 cm. No free fluid or pelvic mass. Incidental note is made of an ectopic kidney located in the right lower quadrant. IMPRESSION: 1. No evidence of intrauterine pregnancy at this time. Serial beta HCG measurements and follow-up ultrasound may be needed to document a live intrauterine pregnancy. 2. Incidental pelvic kidney. Electronically Signed   By: Sharlet Salina M.D.   On: 01/30/2020 15:25    MAU Course  Procedures  MDM -r/o ectopic -UA: sm hgb/rare bacteria -CBC: WNL -CMP: no abnormalities requiring treatment -[place]: PUL, pelvic kidney -hCG: 27 -ABO: O Positive -WetPrep: +trich, treated with metronidazole 2g in MAU -GC/CT collected -EPT unable to be prescribed for patient as EPT treatment for trichomonas is not allowed under public health law Discussed with client the diagnosis of pregnancy of unknown anatomic location.   Three possibilities of outcome are: a healthy pregnancy that is too early to see a yolk sac to confirm the pregnancy is in the uterus, a pregnancy that is not healthy and has not developed and will not develop, and an ectopic pregnancy that is in the abdomen that cannot be identified at this time.  And ectopic pregnancy can be a life threatening situation as a pregnancy needs to be in the uterus which is a muscle and can stretch to accommodate the growth of a pregnancy.  Other structures in the pelvis and abdomen as not muscular and do not stretch with the growth of a pregnancy.  Worst case scenario is that a structure ruptures with a growing pregnancy not in the uterus and and internal hemorrhage can be a life threatening situation.  We need to follow the progression of this pregnancy carefully.  We need to check another serum pregnancy hormone level to determine if the levels are rising appropriately  and to determine the next steps that are needed for you. Patient's questions were answered. -pt discharged to home in stable condition  Orders Placed This Encounter  Procedures   Wet prep, genital    Standing Status:   Standing    Number of Occurrences:   1   US OB LESS THAN 14 WEEKS WITH OB TRANSVAGINAL    Standing Status:   Standing    Number of Occurrences:   1    Order Specific Question:   Symptom/Reason for Exam    Answer:  Vaginal bleeding in pregnancy [[id number]]   Urinalysis, Routine w reflex microscopic Urine, Clean Catch    Standing Status:   Standing    Number of Occurrences:   1   CBC    Standing Status:   Standing    Number of Occurrences:   1   Comprehensive metabolic panel    Standing Status:   Standing    Number of Occurrences:   1   hCG, quantitative, pregnancy    Standing Status:   Standing    Number of Occurrences:   1   Discharge patient    Order Specific Question:   Discharge disposition    Answer:   01-Home or Self Care [1]    Order Specific Question:   Discharge  patient date    Answer:   01/30/2020   Meds ordered this encounter  Medications   metroNIDAZOLE (FLAGYL) tablet 2,000 mg    Assessment and Plan   1. Pregnancy of unknown anatomic location   2. Vaginal bleeding in pregnancy   3. Blood type, Rh positive     Allergies as of 01/30/2020   No Known Allergies     Medication List    TAKE these medications   amoxicillin-clavulanate 875-125 MG tablet Commonly known as: AUGMENTIN Take 1 tablet by mouth every 12 (twelve) hours.   Doxylamine-Pyridoxine 10-10 MG Tbec Take 1 tablet by mouth 2 (two) times daily as needed.   ondansetron 8 MG disintegrating tablet Commonly known as: ZOFRAN-ODT Take 1 tablet (8 mg total) by mouth every 8 (eight) hours as needed for nausea or vomiting.       -will call with culture results, if positive -safe meds in pregnancy list given -list of OB providers given -discussed ectopic vs. SAB vs. miscarriage -strict ectopic precautions given -return MAU precautions -f/u on MAU at 230PM at 02/01/2020 for repeat hCG -pt discharged to home in stable condition  Joni Reiningicole E Jannat Rosemeyer 01/30/2020, 4:29 PM

## 2020-01-30 NOTE — Discharge Instructions (Signed)
Ectopic Pregnancy ° °An ectopic pregnancy is when the fertilized egg attaches (implants) outside the uterus. Most ectopic pregnancies occur in one of the tubes where eggs travel from the ovary to the uterus (fallopian tubes), but the implanting can occur in other locations. In rare cases, ectopic pregnancies occur on the ovary, intestine, pelvis, abdomen, or cervix. In an ectopic pregnancy, the fertilized egg does not have the ability to develop into a normal, healthy baby. °A ruptured ectopic pregnancy is one in which tearing or bursting of a fallopian tube causes internal bleeding. Often, there is intense lower abdominal pain, and vaginal bleeding sometimes occurs. Having an ectopic pregnancy can be life-threatening. If this dangerous condition is not treated, it can lead to blood loss, shock, or even death. °What are the causes? °The most common cause of this condition is damage to one of the fallopian tubes. A fallopian tube may be narrowed or blocked, and that keeps the fertilized egg from reaching the uterus. °What increases the risk? °This condition is more likely to develop in women of childbearing age who have different levels of risk. The levels of risk can be divided into three categories. °High risk °· You have gone through infertility treatment. °· You have had an ectopic pregnancy before. °· You have had surgery on the fallopian tubes, or another surgical procedure, such as an abortion. °· You have had surgery to have the fallopian tubes tied (tubal ligation). °· You have problems or diseases of the fallopian tubes. °· You have been exposed to diethylstilbestrol (DES). This medicine was used until 1971, and it had effects on babies whose mothers took the medicine. °· You become pregnant while using an IUD (intrauterine device) for birth control. °Moderate risk °· You have a history of infertility. °· You have had an STI (sexually transmitted infection). °· You have a history of pelvic inflammatory  disease (PID). °· You have scarring from endometriosis. °· You have multiple sexual partners. °· You smoke. °Low risk °· You have had pelvic surgery. °· You use vaginal douches. °· You became sexually active before age 18. °What are the signs or symptoms? °Common symptoms of this condition include normal pregnancy symptoms, such as missing a period, nausea, tiredness, abdominal pain, breast tenderness, and bleeding. However, ectopic pregnancy will have additional symptoms, such as: °· Pain with intercourse. °· Irregular vaginal bleeding or spotting. °· Cramping or pain on one side or in the lower abdomen. °· Fast heartbeat, low blood pressure, and sweating. °· Passing out while having a bowel movement. °Symptoms of a ruptured ectopic pregnancy and internal bleeding may include: °· Sudden, severe pain in the abdomen and pelvis. °· Dizziness, weakness, light-headedness, or fainting. °· Pain in the shoulder or neck area. °How is this diagnosed? °This condition is diagnosed by: °· A pelvic exam to locate pain or a mass in the abdomen. °· A pregnancy test. This blood test checks for the presence as well as the specific level of pregnancy hormone in the bloodstream. °· Ultrasound. This is performed if a pregnancy test is positive. In this test, a probe is inserted into the vagina. The probe will detect a fetus, possibly in a location other than the uterus. °· Taking a sample of uterus tissue (dilation and curettage, or D&C). °· Surgery to perform a visual exam of the inside of the abdomen using a thin, lighted tube that has a tiny camera on the end (laparoscope). °· Culdocentesis. This procedure involves inserting a needle at the top of   the vagina, behind the uterus. If blood is present in this area, it may indicate that a fallopian tube is torn. How is this treated? This condition is treated with medicine or surgery. Medicine  An injection of a medicine (methotrexate) may be given to cause the pregnancy tissue to be  absorbed. This medicine may save your fallopian tube. It may be given if: ? The diagnosis is made early, with no signs of active bleeding. ? The fallopian tube has not ruptured. ? You are considered to be a good candidate for the medicine. Usually, pregnancy hormone blood levels are checked after methotrexate treatment. This is to be sure that the medicine is effective. It may take 4-6 weeks for the pregnancy to be absorbed. Most pregnancies will be absorbed by 3 weeks. Surgery  A laparoscope may be used to remove the pregnancy tissue.  If severe internal bleeding occurs, a larger cut (incision) may be made in the lower abdomen (laparotomy) to remove the fetus and placenta. This is done to stop the bleeding.  Part or all of the fallopian tube may be removed (salpingectomy) along with the fetus and placenta. The fallopian tube may also be repaired during the surgery.  In very rare circumstances, removal of the uterus (hysterectomy) may be required.  After surgery, pregnancy hormone testing may be done to be sure that there is no pregnancy tissue left. Whether your treatment is medicine or surgery, you may receive a Rho (D) immune globulin shot to prevent problems with any future pregnancy. This shot may be given if:  You are Rh-negative and the baby's father is Rh-positive.  You are Rh-negative and you do not know the Rh type of the baby's father. Follow these instructions at home:  Rest and limit your activity after the procedure for as long as told by your health care provider.  Until your health care provider says that it is safe: ? Do not lift anything that is heavier than 10 lb (4.5 kg), or the limit that your health care provider tells you. ? Avoid physical exercise and any movement that requires effort (is strenuous).  To help prevent constipation: ? Eat a healthy diet that includes fruits, vegetables, and whole grains. ? Drink 6-8 glasses of water per day. Get help right away  if:  You develop worsening pain that is not relieved by medicine.  You have: ? A fever or chills. ? Vaginal bleeding. ? Redness and swelling at the incision site. ? Nausea and vomiting.  You feel dizzy or weak.  You feel light-headed or you faint. This information is not intended to replace advice given to you by your health care provider. Make sure you discuss any questions you have with your health care provider. Document Revised: 02/17/2017 Document Reviewed: 10/07/2015 Elsevier Patient Education  2020 [employer]        First Trimester of Pregnancy The first trimester of pregnancy is from week 1 until the end of week 13 (months 1 through 3). A week after a sperm fertilizes an egg, the egg will implant on the wall of the uterus. This embryo will begin to develop into a baby. Genes from you and your partner will form the baby. The female genes will determine whether the baby will be a boy or a girl. At 6-8 weeks, the eyes and face will be formed, and the heartbeat can be seen on ultrasound. At the end of 12 weeks, all the baby's organs will be formed. Now that you are   pregnant, you will want to do everything you can to have a healthy baby. Two of the most important things are to get good prenatal care and to follow your health care provider's instructions. Prenatal care is all the medical care you receive before the baby's birth. This care will help prevent, find, and treat any problems during the pregnancy and childbirth. Body changes during your first trimester Your body goes through many changes during pregnancy. The changes vary from woman to woman.  You may gain or lose a couple of pounds at first.  You may feel sick to your stomach (nauseous) and you may throw up (vomit). If the vomiting is uncontrollable, call your health care provider.  You may tire easily.  You may develop headaches that can be relieved by medicines. All medicines should be approved by your health care  provider.  You may urinate more often. Painful urination may mean you have a bladder infection.  You may develop heartburn as a result of your pregnancy.  You may develop constipation because certain hormones are causing the muscles that push stool through your intestines to slow down.  You may develop hemorrhoids or swollen veins (varicose veins).  Your breasts may begin to grow larger and become tender. Your nipples may stick out more, and the tissue that surrounds them (areola) may become darker.  Your gums may bleed and may be sensitive to brushing and flossing.  Dark spots or blotches (chloasma, mask of pregnancy) may develop on your face. This will likely fade after the baby is born.  Your menstrual periods will stop.  You may have a loss of appetite.  You may develop cravings for certain kinds of food.  You may have changes in your emotions from day to day, such as being excited to be pregnant or being concerned that something may go wrong with the pregnancy and baby.  You may have more vivid and strange dreams.  You may have changes in your hair. These can include thickening of your hair, rapid growth, and changes in texture. Some women also have hair loss during or after pregnancy, or hair that feels dry or thin. Your hair will most likely return to normal after your baby is born. What to expect at prenatal visits During a routine prenatal visit:  You will be weighed to make sure you and the baby are growing normally.  Your blood pressure will be taken.  Your abdomen will be measured to track your baby's growth.  The fetal heartbeat will be listened to between weeks 10 and 14 of your pregnancy.  Test results from any previous visits will be discussed. Your health care provider may ask you:  How you are feeling.  If you are feeling the baby move.  If you have had any abnormal symptoms, such as leaking fluid, bleeding, severe headaches, or abdominal cramping.  If  you are using any tobacco products, including cigarettes, chewing tobacco, and electronic cigarettes.  If you have any questions. Other tests that may be performed during your first trimester include:  Blood tests to find your blood type and to check for the presence of any previous infections. The tests will also be used to check for low iron levels (anemia) and protein on red blood cells (Rh antibodies). Depending on your risk factors, or if you previously had diabetes during pregnancy, you may have tests to check for high blood sugar that affects pregnant women (gestational diabetes).  Urine tests to check for infections, diabetes,   or protein in the urine.  An ultrasound to confirm the proper growth and development of the baby.  Fetal screens for spinal cord problems (spina bifida) and Down syndrome.  HIV (human immunodeficiency virus) testing. Routine prenatal testing includes screening for HIV, unless you choose not to have this test.  You may need other tests to make sure you and the baby are doing well. Follow these instructions at home: Medicines  Follow your health care provider's instructions regarding medicine use. Specific medicines may be either safe or unsafe to take during pregnancy.  Take a prenatal vitamin that contains at least 600 micrograms (mcg) of folic acid.  If you develop constipation, try taking a stool softener if your health care provider approves. Eating and drinking   Eat a balanced diet that includes fresh fruits and vegetables, whole grains, good sources of protein such as meat, eggs, or tofu, and low-fat dairy. Your health care provider will help you determine the amount of weight gain that is right for you.  Avoid raw meat and uncooked cheese. These carry germs that can cause birth defects in the baby.  Eating four or five small meals rather than three large meals a day may help relieve nausea and vomiting. If you start to feel nauseous, eating a few  soda crackers can be helpful. Drinking liquids between meals, instead of during meals, also seems to help ease nausea and vomiting.  Limit foods that are high in fat and processed sugars, such as fried and sweet foods.  To prevent constipation: ? Eat foods that are high in fiber, such as fresh fruits and vegetables, whole grains, and beans. ? Drink enough fluid to keep your urine clear or pale yellow. Activity  Exercise only as directed by your health care provider. Most women can continue their usual exercise routine during pregnancy. Try to exercise for 30 minutes at least 5 days a week. Exercising will help you: ? Control your weight. ? Stay in shape. ? Be prepared for labor and delivery.  Experiencing pain or cramping in the lower abdomen or lower back is a good sign that you should stop exercising. Check with your health care provider before continuing with normal exercises.  Try to avoid standing for long periods of time. Move your legs often if you must stand in one place for a long time.  Avoid heavy lifting.  Wear low-heeled shoes and practice good posture.  You may continue to have sex unless your health care provider tells you not to. Relieving pain and discomfort  Wear a good support bra to relieve breast tenderness.  Take warm sitz baths to soothe any pain or discomfort caused by hemorrhoids. Use hemorrhoid cream if your health care provider approves.  Rest with your legs elevated if you have leg cramps or low back pain.  If you develop varicose veins in your legs, wear support hose. Elevate your feet for 15 minutes, 3-4 times a day. Limit salt in your diet. Prenatal care  Schedule your prenatal visits by the twelfth week of pregnancy. They are usually scheduled monthly at first, then more often in the last 2 months before delivery.  Write down your questions. Take them to your prenatal visits.  Keep all your prenatal visits as told by your health care provider.  This is important. Safety  Wear your seat belt at all times when driving.  Make a list of emergency phone numbers, including numbers for family, friends, the hospital, and police and fire departments. General   instructions  Ask your health care provider for a referral to a local prenatal education class. Begin classes no later than the beginning of month 6 of your pregnancy.  Ask for help if you have counseling or nutritional needs during pregnancy. Your health care provider can offer advice or refer you to specialists for help with various needs.  Do not use hot tubs, steam rooms, or saunas.  Do not douche or use tampons or scented sanitary pads.  Do not cross your legs for long periods of time.  Avoid cat litter boxes and soil used by cats. These carry germs that can cause birth defects in the baby and possibly loss of the fetus by miscarriage or stillbirth.  Avoid all smoking, herbs, alcohol, and medicines not prescribed by your health care provider. Chemicals in these products affect the formation and growth of the baby.  Do not use any products that contain nicotine or tobacco, such as cigarettes and e-cigarettes. If you need help quitting, ask your health care provider. You may receive counseling support and other resources to help you quit.  Schedule a dentist appointment. At home, brush your teeth with a soft toothbrush and be gentle when you floss. Contact a health care provider if:  You have dizziness.  You have mild pelvic cramps, pelvic pressure, or nagging pain in the abdominal area.  You have persistent nausea, vomiting, or diarrhea.  You have a bad smelling vaginal discharge.  You have pain when you urinate.  You notice increased swelling in your face, hands, legs, or ankles.  You are exposed to fifth disease or chickenpox.  You are exposed to German measles (rubella) and have never had it. Get help right away if:  You have a fever.  You are leaking fluid  from your vagina.  You have spotting or bleeding from your vagina.  You have severe abdominal cramping or pain.  You have rapid weight gain or loss.  You vomit blood or material that looks like coffee grounds.  You develop a severe headache.  You have shortness of breath.  You have any kind of trauma, such as from a fall or a car accident. Summary  The first trimester of pregnancy is from week 1 until the end of week 13 (months 1 through 3).  Your body goes through many changes during pregnancy. The changes vary from woman to woman.  You will have routine prenatal visits. During those visits, your health care provider will examine you, discuss any test results you may have, and talk with you about how you are feeling. This information is not intended to replace advice given to you by your health care provider. Make sure you discuss any questions you have with your health care provider. Document Revised: 02/17/2017 Document Reviewed: 02/17/2016 Elsevier Patient Education  2020 [employer]        Miscarriage A miscarriage is the loss of an unborn baby (fetus) before the 20th week of pregnancy. Most miscarriages happen during the first 3 months of pregnancy. Sometimes, a miscarriage can happen before a woman knows that she is pregnant. Having a miscarriage can be an emotional experience. If you have had a miscarriage, talk with your health care provider about any questions you may have about miscarrying, the grieving process, and your plans for future pregnancy. What are the causes? A miscarriage may be caused by:  Problems with the genes or chromosomes of the fetus. These problems make it impossible for the baby to develop normally. They   are often the result of random errors that occur early in the development of the baby, and are not passed from parent to child (not inherited).  Infection of the cervix or uterus.  Conditions that affect hormone balance in the  body.  Problems with the cervix, such as the cervix opening and thinning before pregnancy is at term (cervical insufficiency).  Problems with the uterus. These may include: ? A uterus with an abnormal shape. ? Fibroids in the uterus. ? Congenital abnormalities. These are problems that were present at birth.  Certain medical conditions.  Smoking, drinking alcohol, or using drugs.  Injury (trauma). In many cases, the cause of a miscarriage is not known. What are the signs or symptoms? Symptoms of this condition include:  Vaginal bleeding or spotting, with or without cramps or pain.  Pain or cramping in the abdomen or lower back.  Passing fluid, tissue, or blood clots from the vagina. How is this diagnosed? This condition may be diagnosed based on:  A physical exam.  Ultrasound.  Blood tests.  Urine tests. How is this treated? Treatment for a miscarriage is sometimes not necessary if you naturally pass all the tissue that was in your uterus. If necessary, this condition may be treated with:  Dilation and curettage (D&C). This is a procedure in which the cervix is stretched open and the lining of the uterus (endometrium) is scraped. This is done only if tissue from the fetus or placenta remains in the body (incomplete miscarriage).  Medicines, such as: ? Antibiotic medicine, to treat infection. ? Medicine to help the body pass any remaining tissue. ? Medicine to reduce (contract) the size of the uterus. These medicines may be given if you have a lot of bleeding. If you have Rh negative blood and your baby was Rh positive, you will need a shot of a medicine called Rh immunoglobulinto protect your future babies from Rh blood problems. "Rh-negative" and "Rh-positive" refer to whether or not the blood has a specific protein found on the surface of red blood cells (Rh factor). Follow these instructions at home: Medicines   Take over-the-counter and prescription medicines only as  told by your health care provider.  If you were prescribed antibiotic medicine, take it as told by your health care provider. Do not stop taking the antibiotic even if you start to feel better.  Do not take NSAIDs, such as aspirin and ibuprofen, unless they are approved by your health care provider. These medicines can cause bleeding. Activity  Rest as directed. Ask your health care provider what activities are safe for you.  Have someone help with home and family responsibilities during this time. General instructions  Keep track of the number of sanitary pads you use each day and how soaked (saturated) they are. Write down this information.  Monitor the amount of tissue or blood clots that you pass from your vagina. Save any large amounts of tissue for your health care provider to examine.  Do not use tampons, douche, or have sex until your health care provider approves.  To help you and your partner with the process of grieving, talk with your health care provider or seek counseling.  When you are ready, meet with your health care provider to discuss any important steps you should take for your health. Also, discuss steps you should take to have a healthy pregnancy in the future.  Keep all follow-up visits as told by your health care provider. This is important. Where to find   more information  [employer]: www.acog.org  [place] [employer] and [employer]: http://hoffman.com/ Contact a health care provider if:  You have a fever or chills.  You have a foul smelling vaginal discharge.  You have more bleeding instead of less. Get help right away if:  You have severe cramps or pain in your back or abdomen.  You pass blood clots or tissue from your vagina that is walnut-sized or larger.  You soak more than 1 regular sanitary pad in an hour.  You become light-headed or weak.  You pass out.  You  have feelings of sadness that take over your thoughts, or you have thoughts of hurting yourself. Summary  Most miscarriages happen in the first 3 months of pregnancy. Sometimes miscarriage happens before a woman even knows that she is pregnant.  Follow your health care provider's instruction for home care. Keep all follow-up appointments.  To help you and your partner with the process of grieving, talk with your health care provider or seek counseling. This information is not intended to replace advice given to you by your health care provider. Make sure you discuss any questions you have with your health care provider. Document Revised: 06/29/2018 Document Reviewed: 04/12/2016 Elsevier Patient Education  2020 [employer].        Trichomoniasis Trichomoniasis is an STI (sexually transmitted infection) that can affect both women and men. In women, the outer area of the female genitalia (vulva) and the vagina are affected. In men, mainly the penis is affected, but the prostate and other reproductive organs can also be involved.  This condition can be treated with medicine. It often has no symptoms (is asymptomatic), especially in men. If not treated, trichomoniasis can last for months or years. What are the causes? This condition is caused by a parasite called Trichomonas vaginalis. Trichomoniasis most often spreads from person to person (is contagious) through sexual contact. What increases the risk? The following factors may make you more likely to develop this condition:  Having unprotected sex.  Having sex with a partner who has trichomoniasis.  Having multiple sexual partners.  Having had previous trichomoniasis infections or other STIs. What are the signs or symptoms? In women, symptoms of trichomoniasis include:  Abnormal vaginal discharge that is clear, white, gray, or yellow-green and foamy and has an unusual "fishy" odor.  Itching and irritation of the vagina and  vulva.  Burning or pain during urination or sex.  Redness and swelling of the genitals. In men, symptoms of trichomoniasis include:  Penile discharge that may be foamy or contain pus.  Pain in the penis. This may happen only when urinating.  Itching or irritation inside the penis.  Burning after urination or ejaculation. How is this diagnosed? In women, this condition may be found during a routine Pap test or physical exam. It may be found in men during a routine physical exam. Your health care provider may do tests to help diagnose this infection, such as:  Urine tests (men and women).  The following in women: ? Testing the pH of the vagina. ? A vaginal swab test that checks for the Trichomonas vaginalis parasite. ? Testing vaginal secretions. Your health care provider may test you for other STIs, including HIV (human immunodeficiency virus). How is this treated? This condition is treated with medicine taken by mouth (orally), such as metronidazole or tinidazole, to fight the infection. Your sexual partner(s) also need to be tested  and treated.  If you are a woman and you plan to become pregnant or think you may be pregnant, tell your health care provider right away. Some medicines that are used to treat the infection should not be taken during pregnancy. Your health care provider may recommend over-the-counter medicines or creams to help relieve itching or irritation. You may be tested for infection again 3 months after treatment. Follow these instructions at home:  Take and use over-the-counter and prescription medicines, including creams, only as told by your health care provider.  Take your antibiotic medicine as told by your health care provider. Do not stop taking the antibiotic even if you start to feel better.  Do not have sex until 7-10 days after you finish your medicine, or until your health care provider approves. Ask your health care provider when you may start to  have sex again.  (Women) Do not douche or wear tampons while you have the infection.  Discuss your infection with your sexual partner(s). Make sure that your partner gets tested and treated, if necessary.  Keep all follow-up visits as told by your health care provider. This is important. How is this prevented?   Use condoms every time you have sex. Using condoms correctly and consistently can help protect against STIs.  Avoid having multiple sexual partners.  Talk with your sexual partner about any symptoms that either of you may have, as well as any history of STIs.  Get tested for STIs and STDs (sexually transmitted diseases) before you have sex. Ask your partner to do the same.  Do not have sexual contact if you have symptoms of trichomoniasis or another STI. Contact a health care provider if:  You still have symptoms after you finish your medicine.  You develop pain in your abdomen.  You have pain when you urinate.  You have bleeding after sex.  You develop a rash.  You feel nauseous or you vomit.  You plan to become pregnant or think you may be pregnant. Summary  Trichomoniasis is an STI (sexually transmitted infection) that can affect both women and men.  This condition often has no symptoms (is asymptomatic), especially in men.  Without treatment, this condition can last for months or years.  You should not have sex until 7-10 days after you finish your medicine, or until your health care provider approves. Ask your health care provider when you may start to have sex again.  Discuss your infection with your sexual partner(s). Make sure that your partner gets tested and treated, if necessary. This information is not intended to replace advice given to you by your health care provider. Make sure you discuss any questions you have with your health care provider. Document Revised: 12/19/2017 Document Reviewed: 12/19/2017 Elsevier Patient Education  2020 [employer].                        Safe Medications in Pregnancy    Acne: Benzoyl Peroxide Salicylic Acid  Backache/Headache: Tylenol: 2 regular strength every 4 hours OR              2 Extra strength every 6 hours  Colds/Coughs/Allergies: Benadryl (alcohol free) 25 mg every 6 hours as needed Breath right strips Claritin Cepacol throat lozenges Chloraseptic throat spray Cold-Eeze- up to three times per day Cough drops, alcohol free Flonase (by prescription only) Guaifenesin Mucinex Robitussin DM (plain only, alcohol free) Saline nasal spray/drops Sudafed (pseudoephedrine) & Actifed ** use only after  [redacted] weeks gestation and if you do not have high blood pressure Tylenol Vicks Vaporub Zinc lozenges Zyrtec   Constipation: Colace Ducolax suppositories Fleet enema Glycerin suppositories Metamucil Milk of magnesia Miralax Senokot Smooth move tea  Diarrhea: Kaopectate Imodium A-D  *NO pepto Bismol  Hemorrhoids: Anusol Anusol HC Preparation H Tucks  Indigestion: Tums Maalox Mylanta Zantac  Pepcid  Insomnia: Benadryl (alcohol free) 25mg  every 6 hours as needed Tylenol PM Unisom, no Gelcaps  Leg Cramps: Tums MagGel  Nausea/Vomiting:  Bonine Dramamine Emetrol Ginger extract Sea bands Meclizine  Nausea medication to take during pregnancy:  Unisom (doxylamine succinate 25 mg tablets) Take one tablet daily at bedtime. If symptoms are not adequately controlled, the dose can be increased to a maximum recommended dose of two tablets daily (1/2 tablet in the morning, 1/2 tablet mid-afternoon and one at bedtime). Vitamin B6 100mg  tablets. Take one tablet twice a day (up to 200 mg per day).  Skin Rashes: Aveeno products Benadryl cream or 25mg  every 6 hours as needed Calamine Lotion 1% cortisone cream  Yeast infection: Gyne-lotrimin 7 Monistat 7   **If taking multiple medications, please check labels to avoid duplicating the same active  ingredients **take medication as directed on the label ** Do not exceed 4000 mg of tylenol in 24 hours **Do not take medications that contain aspirin or ibuprofen          Prenatal Care Providers           [facility] @ [facility] - accepts patients without insurance  Phone: [phone]  Center for @ [facility]   Phone: [phone]  [facility] @[place]       Phone: [phone]            [facility] @ [place]     Phone: [phone]          Center for [id number] @ [facility]   Phone: [phone]  [facility] @ [facility] - accepts patients without insurance  Phone: [phone]  [facility] @ [facility] Phone: [phone]     [facility] - accepts patients without insurance Phone: [phone]  [facility]  Phone: [phone]  [facility] Phone: [phone]  [facility] Phone: [phone]  [facility] Phone: [phone]  [facility] Phone: [phone]  [facility]  Phone: [phone]

## 2020-01-31 LAB — GC/CHLAMYDIA PROBE AMP (~~LOC~~) NOT AT ARMC
Chlamydia: NEGATIVE
Comment: NEGATIVE
Comment: NORMAL
Neisseria Gonorrhea: NEGATIVE

## 2020-02-05 ENCOUNTER — Telehealth: Payer: Self-pay | Admitting: Advanced Practice Midwife

## 2020-02-05 NOTE — Telephone Encounter (Signed)
VLTCB re: repeat Quant hCG due 02/01/2020  Clayton Bibles, MSN, CNM Certified Nurse Midwife, [facility] for [facility], [facility] 02/05/20 9:25 AM

## 2020-12-11 IMAGING — US US OB TRANSVAGINAL
1 series · 15 of 28 positions shown · non-contrast
Comparison: 10/27/2015

CLINICAL DATA: Vaginal bleeding, positive pregnancy test

EXAM:
TRANSVAGINAL OB ULTRASOUND
TECHNIQUE: Transvaginal ultrasound was performed for complete evaluation of the
gestation as well as the maternal uterus, adnexal regions, and
pelvic cul-de-sac.

[Series 1: us ob transvaginal · 30 acquisitions, 15 frames shown]
[im 1/30]
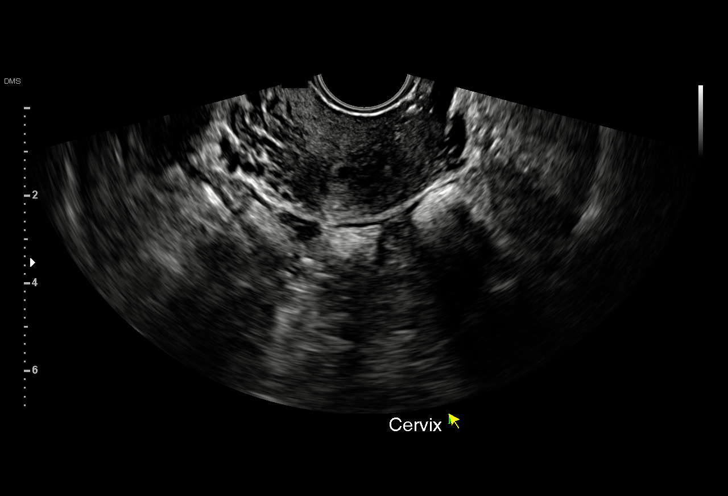
[im 3/30]
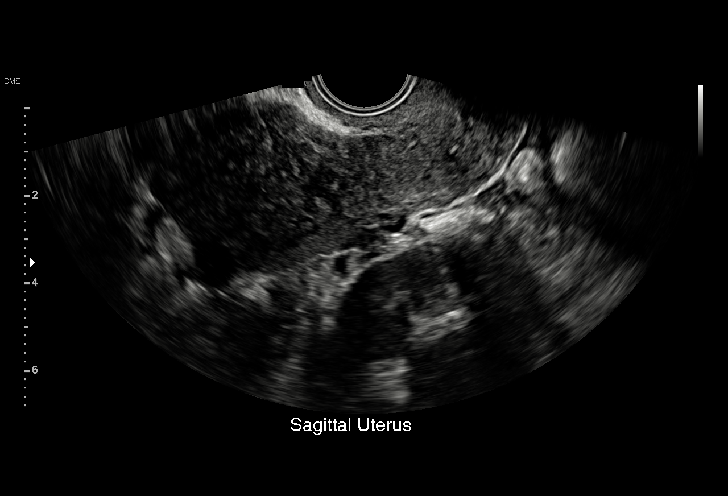
[im 5/30]
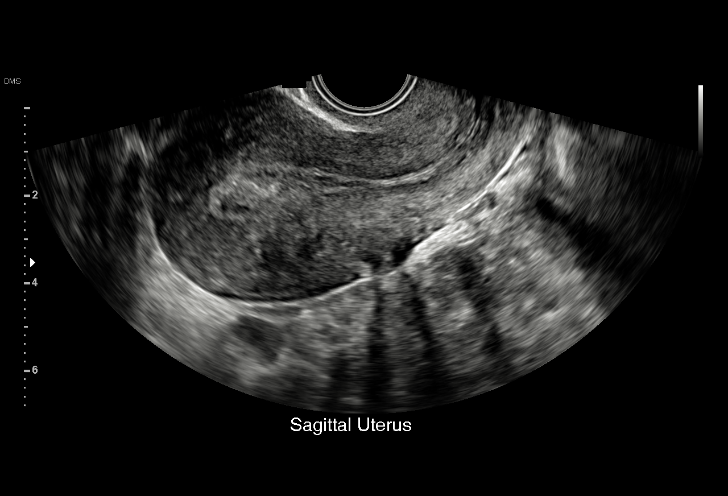
[im 7/30]
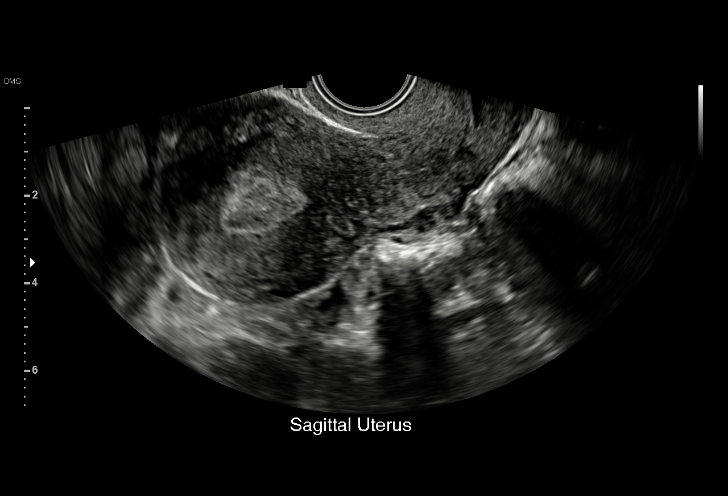
[im 9/30]
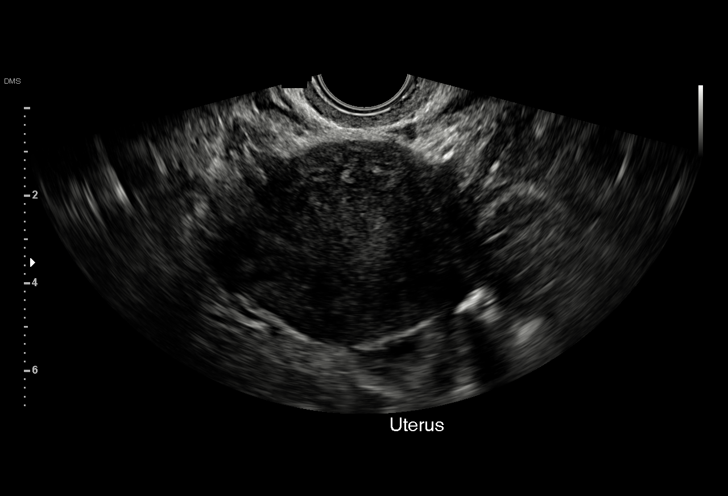
[im 11/30]
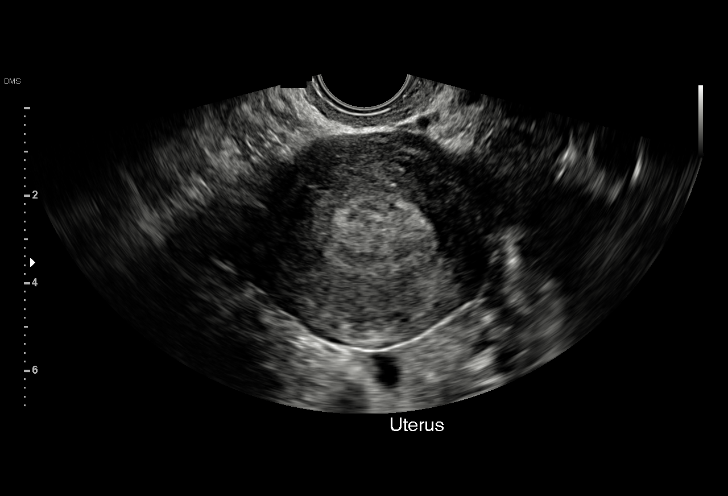
[im 13/30]
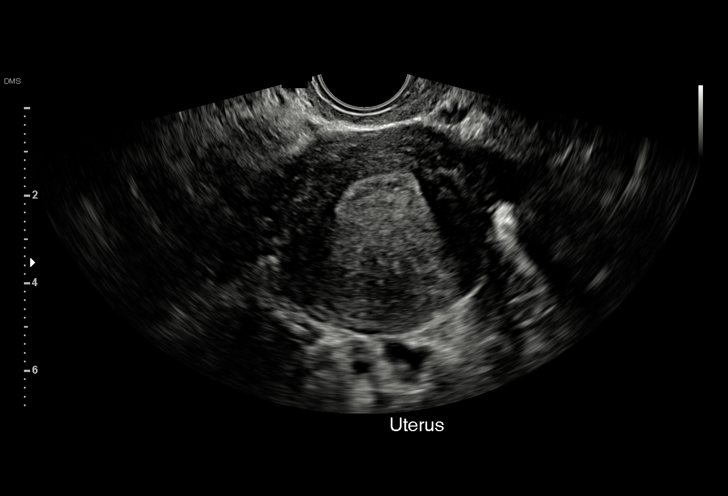
[im 16/30]
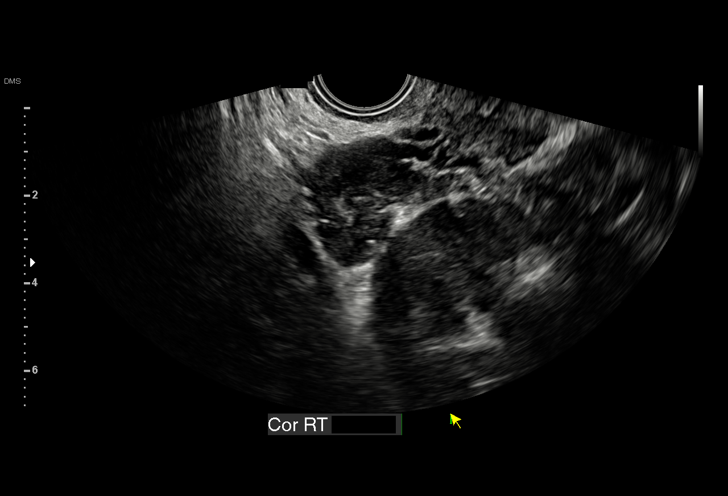
[im 17/30]
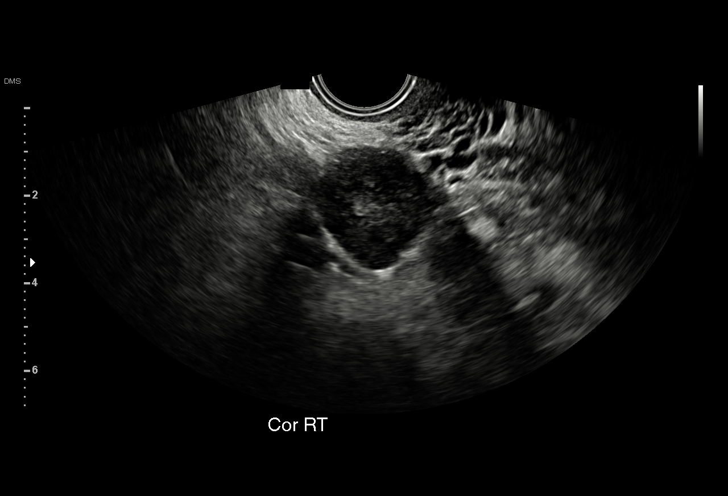
[im 19/30]
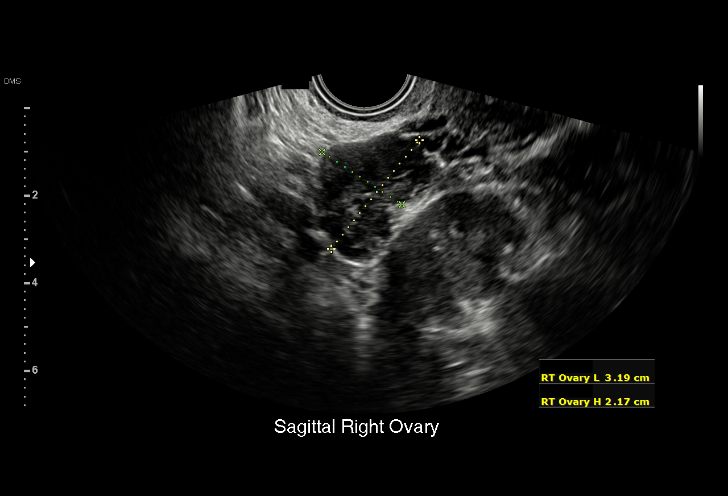
[im 21/30]
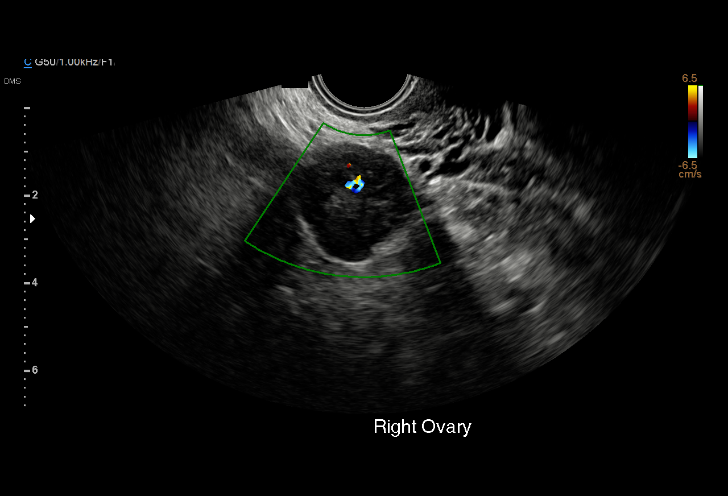
[im 23/30]
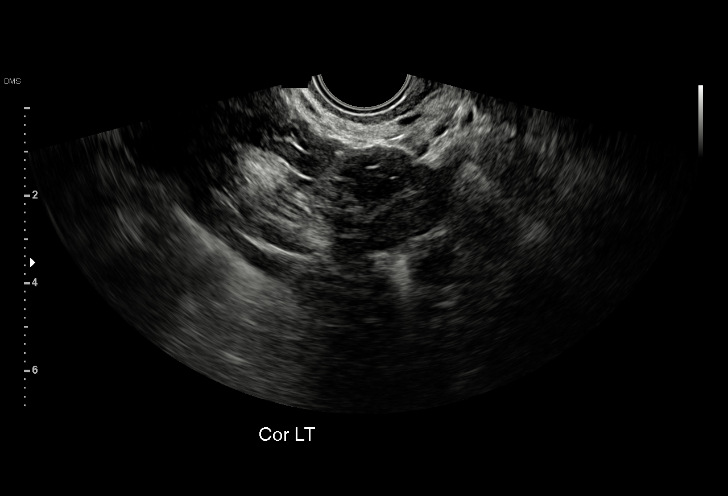
[im 25/30]
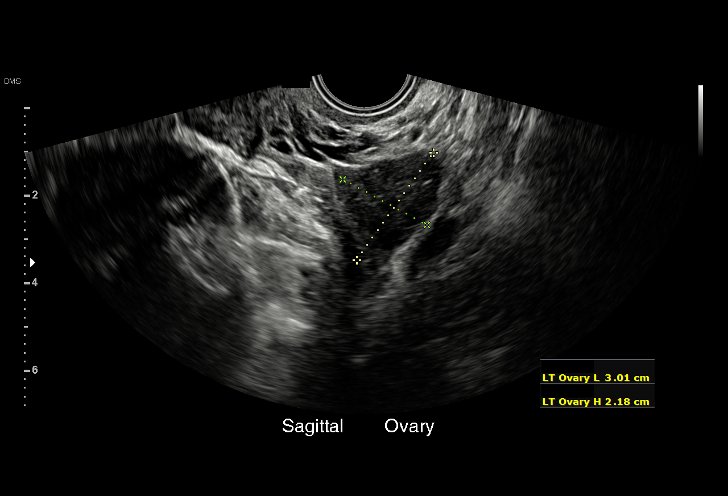
[im 27/30]
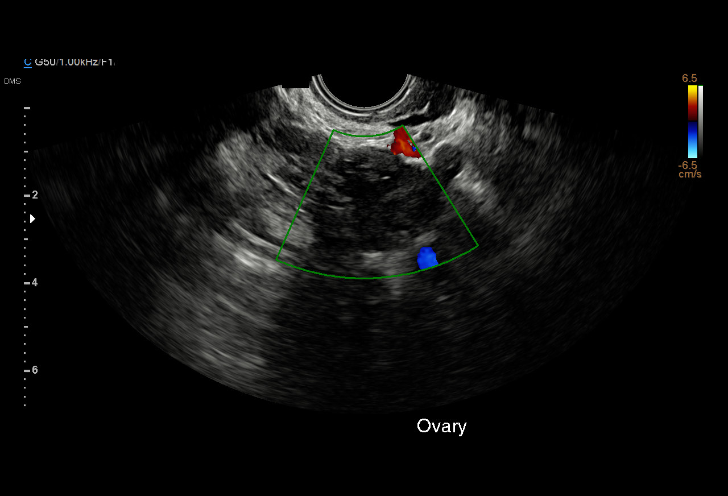
[im 30/30]
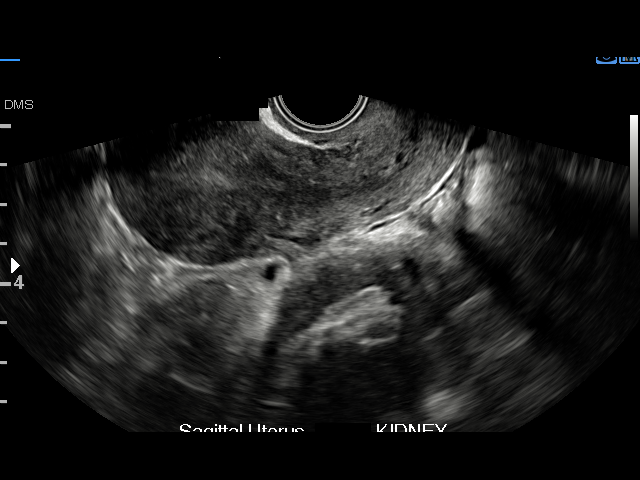

[15 of 28 positions shown; findings below may reference images not displayed]

FINDINGS: Intrauterine gestational sac: Not visualized

Yolk sac:  Not Visualized.

Embryo:  Not Visualized.

Cardiac Activity: Not Visualized.

Heart Rate: None detected

Subchorionic hemorrhage:  None visualized.

Maternal uterus/adnexae: Ovaries appear normal. No pelvic free
fluid. No visualized intrauterine pregnancy.
IMPRESSION: No visualized intrauterine pregnancy or gestational sac. No adnexal
abnormality or free fluid.

Recommend follow-up quantitative B-HCG levels and follow-up US in 14
days to assess viability. This recommendation follows SRU consensus
guidelines: Diagnostic Criteria for Nonviable Pregnancy Early in the
First Trimester. N Engl J Med 7484; [DATE].

## 2021-07-09 ENCOUNTER — Ambulatory Visit (INDEPENDENT_AMBULATORY_CARE_PROVIDER_SITE_OTHER): Payer: Medicaid Other | Admitting: Certified Nurse Midwife

## 2021-07-09 ENCOUNTER — Telehealth: Payer: Self-pay

## 2021-07-09 ENCOUNTER — Encounter: Payer: Self-pay | Admitting: Certified Nurse Midwife

## 2021-07-09 ENCOUNTER — Other Ambulatory Visit: Payer: Self-pay | Admitting: Certified Nurse Midwife

## 2021-07-09 ENCOUNTER — Other Ambulatory Visit (HOSPITAL_COMMUNITY)
Admission: RE | Admit: 2021-07-09 | Discharge: 2021-07-09 | Disposition: A | Discharge status: Home / Self Care | Payer: Medicaid Other | Source: Ambulatory Visit | Attending: Certified Nurse Midwife | Admitting: Certified Nurse Midwife

## 2021-07-09 VITALS — BP 123/88 | HR 82 | Ht 64.0 in | Wt 140.5 lb

## 2021-07-09 DIAGNOSIS — J301 Allergic rhinitis due to pollen: Secondary | ICD-10-CM

## 2021-07-09 DIAGNOSIS — Z1231 Encounter for screening mammogram for malignant neoplasm of breast: Secondary | ICD-10-CM

## 2021-07-09 DIAGNOSIS — Z30013 Encounter for initial prescription of injectable contraceptive: Secondary | ICD-10-CM | POA: Diagnosis not present

## 2021-07-09 DIAGNOSIS — Z01419 Encounter for gynecological examination (general) (routine) without abnormal findings: Secondary | ICD-10-CM | POA: Diagnosis present

## 2021-07-09 LAB — POCT PREGNANCY, URINE: Preg Test, Ur: NEGATIVE

## 2021-07-09 MED ORDER — FLUTICASONE PROPIONATE 50 MCG/ACT NA SUSP: 1.0000 | Freq: Every day | NASAL | 2 refills | Status: AC

## 2021-07-09 MED ORDER — MEDROXYPROGESTERONE ACETATE 150 MG/ML IM SUSP
150.0000 mg | Freq: Once | INTRAMUSCULAR | Status: AC
  Administered 2021-07-09: 150 mg via INTRAMUSCULAR

## 2021-07-09 MED ORDER — CETIRIZINE HCL 10 MG PO TABS: 10.0000 mg | ORAL_TABLET | Freq: Every day | ORAL | 6 refills | Status: AC

## 2021-07-09 NOTE — Telephone Encounter (Signed)
Called and verified identity using 2 forms of ID. I informed patient that her screening mammogram has been scheduled for Wednesday May 3, 23. Time and location is 11:30 at [place].  ? ?I also explained Breast center new policy where she will be charged $75 if she cancels within 24 hours or does not show for her appointment.  ? ?Ms. Fritze verbalized understanding and had no further questions or concerns. ? ? ?Sindhu Nguyen, CMA  ?

## 2021-07-12 NOTE — Progress Notes (Signed)
? ?ANNUAL EXAM ?Patient name: Shelly Hodges MRN [id number]  Date of birth: [birth year] ?Chief Complaint:   ?Gynecologic Exam ? ?History of Present Illness:   ?Shelly Hodges is a 46 y.o. G7P2002 African-American female being seen today for a routine annual exam.  ?Current complaints: allergy symptoms - got very congested after being outside with her family at [place] and it has not resolved.  ? ?Patient's last menstrual period was 07/02/2021 (approximate). ? ?The pregnancy intention screening data noted above was reviewed. Potential methods of contraception were discussed. The patient elected to proceed with Hormonal Injection.  ? ?Last pap 2017. Results were: NILM w/ HRHPV negative. H/O abnormal pap: no ?Last mammogram: Never. Results were: N/A. Family h/o breast cancer: no ?Last colonoscopy: Never (age). Results were: N/A. Family h/o colorectal cancer: no ? ? ?  07/09/2021  ? 10:12 AM 01/12/2016  ? 11:00 AM 11/25/2015  ?  8:06 AM 10/27/2015  ?  3:31 PM  ?Depression screen PHQ 2/9  ?Decreased Interest 0 0 1 0  ?Down, Depressed, Hopeless 0 0 0 0  ?PHQ - 2 Score 0 0 1 0  ?Altered sleeping 1 0 2   ?Tired, decreased energy 0 0 1   ?Change in appetite 0 0 0   ?Feeling bad or failure about yourself  0 0 0   ?Trouble concentrating 0 0 0   ?Moving slowly or fidgety/restless 0 0 0   ?Suicidal thoughts 0 0 0   ?PHQ-9 Score 1 0 4   ? ? ?  07/09/2021  ? 10:12 AM 01/12/2016  ? 11:07 AM 11/25/2015  ?  8:06 AM  ?GAD 7 : Generalized Anxiety Score  ?Nervous, Anxious, on Edge 0 0 0  ?Control/stop worrying 0 0 0  ?Worry too much - different things 0 0 1  ?Trouble relaxing 0 1 0  ?Restless 0 0 0  ?Easily annoyed or irritable 0 0 0  ?Afraid - awful might happen 0 0 0  ?Total GAD 7 Score 0 1 1  ? ?Review of Systems:   ?Pertinent items are noted in HPI ?Denies any headaches, blurred vision, fatigue, shortness of breath, chest pain, abdominal pain, abnormal vaginal discharge/itching/odor/irritation, problems with periods, bowel movements,  urination, or intercourse unless otherwise stated above. ?Pertinent History Reviewed:  ?Reviewed past medical,surgical, social and family history.  ?Reviewed problem list, medications and allergies. ?Physical Assessment:  ? ?Vitals:  ? 07/09/21 1017  ?BP: 123/88  ?Pulse: 82  ?Weight: 140 lb 8 oz (63.7 kg)  ?Height: 5\' 4"  (1.626 m)  ? Body mass index is 24.12 kg/m?. ?  ?     Physical Examination:  ? General appearance - well appearing, and in no distress ? Mental status - alert, oriented to person, place, and time ? Psych:  She has a normal mood and affect ? Skin - warm and dry, normal color, no suspicious lesions noted ? Chest - effort normal ? Heart - normal rate and regular rhythm ? Neck:  midline trachea, no thyromegaly or nodules ? Breasts - breasts appear normal, no suspicious masses, no skin or nipple changes or  axillary nodes ? Abdomen - soft, nontender, nondistended, no masses or organomegaly ? Pelvic - VULVA: normal appearing vulva with no masses, tenderness or lesions  VAGINA: normal appearing vagina with normal color and discharge, no lesions  CERVIX: normal appearing cervix without discharge or lesions, no CMT ? Thin prep pap is done with HR HPV cotesting ? UTERUS: uterus is felt to be  normal size, shape, consistency and nontender  ? ADNEXA: No adnexal masses or tenderness noted. ? Extremities:  No swelling or varicosities noted ? ?Chaperone present for exam ? ?No results found for this or any previous visit (from the past 24 hour(s)).  ?Assessment & Plan:  ?1. Encounter for annual routine gynecological examination ?- Cytology - PAP( [place]) ?- Ambulatory referral to Family Practice ? ?2. Encounter for initial prescription of injectable contraceptive ?- Pregnancy, urine POC ?- medroxyPROGESTERone (DEPO-PROVERA) injection 150 mg ? ?3. Seasonal allergic rhinitis due to pollen ?- cetirizine (ZYRTEC) 10 MG tablet; Take 1 tablet (10 mg total) by mouth at bedtime.  Dispense: 30 tablet; Refill: 6 ?-  fluticasone (FLONASE) 50 MCG/ACT nasal spray; Place 1 spray into both nostrils daily.  Dispense: 9.9 mL; Refill: 2 ? ?Will follow up results of pap smear and manage accordingly. ?Mammogram scheduled ?Routine preventative health maintenance measures emphasized. ?Please refer to After Visit Summary for other counseling recommendations.  ?    ? ?Mammogram: @ 44yo, or sooner if problems ?Colonoscopy: @ 45yo, or sooner if problems ? ?Orders Placed This Encounter  ?Procedures  ? Ambulatory referral to Family Practice  ? Pregnancy, urine POC  ? ? ?Meds:  ?Meds ordered this encounter  ?Medications  ? cetirizine (ZYRTEC) 10 MG tablet  ?  Sig: Take 1 tablet (10 mg total) by mouth at bedtime.  ?  Dispense:  30 tablet  ?  Refill:  6  ?  Order Specific Question:   Supervising Provider  ?  Answer:   Donnamae Jude [id number]  ? fluticasone (FLONASE) 50 MCG/ACT nasal spray  ?  Sig: Place 1 spray into both nostrils daily.  ?  Dispense:  9.9 mL  ?  Refill:  2  ?  Order Specific Question:   Supervising Provider  ?  Answer:   Donnamae Jude [id number]  ? medroxyPROGESTERone (DEPO-PROVERA) injection 150 mg  ? ? ?Follow-up: No follow-ups on file. ? ?Gabriel Carina, CNM ?07/12/2021 ?10:15 PM ?

## 2021-07-13 LAB — CYTOLOGY - PAP
Comment: NEGATIVE
Diagnosis: NEGATIVE
High risk HPV: NEGATIVE

## 2021-07-21 ENCOUNTER — Ambulatory Visit: Payer: Medicaid Other

## 2021-09-27 ENCOUNTER — Ambulatory Visit: Payer: Medicaid Other

## 2021-10-06 ENCOUNTER — Other Ambulatory Visit: Payer: Self-pay

## 2021-10-06 ENCOUNTER — Ambulatory Visit (INDEPENDENT_AMBULATORY_CARE_PROVIDER_SITE_OTHER): Payer: Medicaid Other

## 2021-10-06 VITALS — BP 139/87 | HR 76 | Wt 139.1 lb

## 2021-10-06 DIAGNOSIS — Z3042 Encounter for surveillance of injectable contraceptive: Secondary | ICD-10-CM | POA: Diagnosis not present

## 2021-10-06 MED ORDER — MEDROXYPROGESTERONE ACETATE 150 MG/ML IM SUSP
150.0000 mg | Freq: Once | INTRAMUSCULAR | Status: AC
  Administered 2021-10-06: 150 mg via INTRAMUSCULAR

## 2021-10-06 NOTE — Progress Notes (Signed)
Domingo Cocking here for Depo-Provera Injection. Injection administered without complication. Patient will return in 3 months for next injection between October 4 and October 18. Next annual visit due April 2023.   Cline Crock, RN 10/06/2021

## 2021-12-22 ENCOUNTER — Ambulatory Visit (INDEPENDENT_AMBULATORY_CARE_PROVIDER_SITE_OTHER): Payer: Medicaid Other | Admitting: General Practice

## 2021-12-22 ENCOUNTER — Other Ambulatory Visit: Payer: Self-pay

## 2021-12-22 VITALS — BP 152/94 | HR 71 | Ht 63.0 in | Wt 148.0 lb

## 2021-12-22 DIAGNOSIS — Z3042 Encounter for surveillance of injectable contraceptive: Secondary | ICD-10-CM | POA: Diagnosis not present

## 2021-12-22 MED ORDER — MEDROXYPROGESTERONE ACETATE 150 MG/ML IM SUSP
150.0000 mg | Freq: Once | INTRAMUSCULAR | Status: AC
  Administered 2021-12-22: 150 mg via INTRAMUSCULAR

## 2021-12-22 NOTE — Progress Notes (Signed)
Shelly Hodges here for Depo-Provera Injection. Injection administered without complication. Patient will return in 3 months for next injection between 12/20 and 1/3. Patient's blood pressures are 150/96 & 152/94. She denies history of HTN but has had more headaches recently. Advised patient to reach out to PCP and let them know. Next annual visit due April 2024.   Derinda Late, RN 12/22/2021  3:26 PM

## 2022-03-23 ENCOUNTER — Ambulatory Visit (INDEPENDENT_AMBULATORY_CARE_PROVIDER_SITE_OTHER): Payer: No Typology Code available for payment source

## 2022-03-23 ENCOUNTER — Other Ambulatory Visit: Payer: Self-pay

## 2022-03-23 VITALS — BP 151/100 | HR 76 | Wt 145.9 lb

## 2022-03-23 DIAGNOSIS — Z3042 Encounter for surveillance of injectable contraceptive: Secondary | ICD-10-CM

## 2022-03-23 DIAGNOSIS — I10 Essential (primary) hypertension: Secondary | ICD-10-CM

## 2022-03-23 MED ORDER — MEDROXYPROGESTERONE ACETATE 150 MG/ML IM SUSP
150.0000 mg | Freq: Once | INTRAMUSCULAR | Status: AC
  Administered 2022-03-23: 150 mg via INTRAMUSCULAR

## 2022-03-23 MED ORDER — AMLODIPINE BESYLATE 5 MG PO TABS: 5.0000 mg | ORAL_TABLET | Freq: Every day | ORAL | 2 refills | Status: DC

## 2022-03-23 NOTE — Progress Notes (Signed)
Shelly Hodges here for Depo-Provera Injection. Reports intermittent vaginal bleeding 11/22-12/25/23; no other period of bleeding since beginning Depo Provera and patient is not concerned. Injection administered without complication. Patient will return in 3 months for next injection between 06/09/22 and 06/23/22. Next annual visit due April 2024; pt would like to schedule this after next injection.  BP elevated today at 174/100 with repeat of 151/100. Per chart review BP was also elevated at last Depo nurse visit. Reviewed with Ndulue, MD who recommends start Norvasc 5 MG daily and establish with PCP. Pt referred to [facility]; first available appt 05/23/21.  Annabell Howells, RN 03/23/2022  3:04 PM

## 2022-05-24 ENCOUNTER — Ambulatory Visit (INDEPENDENT_AMBULATORY_CARE_PROVIDER_SITE_OTHER): Payer: Medicaid Other | Admitting: Primary Care

## 2022-06-09 ENCOUNTER — Ambulatory Visit: Payer: Medicaid Other

## 2022-06-13 ENCOUNTER — Ambulatory Visit (INDEPENDENT_AMBULATORY_CARE_PROVIDER_SITE_OTHER): Payer: No Typology Code available for payment source

## 2022-06-13 ENCOUNTER — Other Ambulatory Visit: Payer: Self-pay

## 2022-06-13 VITALS — BP 138/80 | HR 78 | Ht 63.0 in | Wt 151.6 lb

## 2022-06-13 DIAGNOSIS — Z3042 Encounter for surveillance of injectable contraceptive: Secondary | ICD-10-CM | POA: Diagnosis not present

## 2022-06-13 MED ORDER — MEDROXYPROGESTERONE ACETATE 150 MG/ML IM SUSP
150.0000 mg | Freq: Once | INTRAMUSCULAR | Status: AC
  Administered 2022-06-13: 150 mg via INTRAMUSCULAR

## 2022-06-13 NOTE — Progress Notes (Signed)
Shelly Hodges here for Depo-Provera Injection. Last injection on 03/23/22; patient is [redacted]w[redacted]d from last injection, patient is within window for next injection today. Patient reports no unprotected sex in last 2 weeks. Injection administered without complication. Patient will return in 3 months for next injection between 6/10 and 6/24. Next annual visit due 07/10/22.   Alinda Money, RN 06/13/2022  8:34 AM

## 2022-07-07 ENCOUNTER — Ambulatory Visit (INDEPENDENT_AMBULATORY_CARE_PROVIDER_SITE_OTHER): Payer: No Typology Code available for payment source | Admitting: Primary Care

## 2022-07-07 ENCOUNTER — Encounter (INDEPENDENT_AMBULATORY_CARE_PROVIDER_SITE_OTHER): Payer: Self-pay | Admitting: Primary Care

## 2022-07-07 VITALS — BP 130/82 | HR 80 | Ht 64.0 in | Wt 148.8 lb

## 2022-07-07 DIAGNOSIS — D649 Anemia, unspecified: Secondary | ICD-10-CM

## 2022-07-07 DIAGNOSIS — Z6825 Body mass index (BMI) 25.0-25.9, adult: Secondary | ICD-10-CM

## 2022-07-07 DIAGNOSIS — Z1159 Encounter for screening for other viral diseases: Secondary | ICD-10-CM

## 2022-07-07 DIAGNOSIS — Z7689 Persons encountering health services in other specified circumstances: Secondary | ICD-10-CM

## 2022-07-07 DIAGNOSIS — F17210 Nicotine dependence, cigarettes, uncomplicated: Secondary | ICD-10-CM

## 2022-07-07 DIAGNOSIS — Z13220 Encounter for screening for lipoid disorders: Secondary | ICD-10-CM

## 2022-07-07 DIAGNOSIS — Z131 Encounter for screening for diabetes mellitus: Secondary | ICD-10-CM

## 2022-07-07 DIAGNOSIS — I10 Essential (primary) hypertension: Secondary | ICD-10-CM

## 2022-07-07 MED ORDER — AMLODIPINE BESYLATE 10 MG PO TABS
10.0000 mg | ORAL_TABLET | Freq: Every day | ORAL | 1 refills | Status: DC
Start: 2022-07-07 — End: 2023-02-22

## 2022-07-07 NOTE — Patient Instructions (Signed)
Cooking With Less Salt Cooking with less salt is one way to reduce the amount of sodium you get from food. Sodium is one of the elements that make up salt. It is found naturally in foods and is also added to certain foods. Depending on your condition and overall health, your health care provider or dietitian may recommend that you reduce your sodium intake. Most people should have less than 2,300 milligrams (mg) of sodium each day. If you have high blood pressure (hypertension), you may need to limit your sodium to 1,500 mg each day. Follow the tips below to help reduce your sodium intake. What are tips for eating less sodium? Reading food labels  Check the food label before buying or using packaged ingredients. Always check the label for the serving size and sodium content. Look for products with no more than 140 mg of sodium in one serving. Check the % Daily Value column to see what percent of the daily recommended amount of sodium is provided in one serving of the product. Foods with 5% or less in this column are considered low in sodium. Foods with 20% or higher are considered high in sodium. Do not choose foods with salt as one of the first three ingredients on the ingredients list. If salt is one of the first three ingredients, it usually means the item is high in sodium. Shopping Buy sodium-free or low-sodium products. Look for the following words on food labels: Low-sodium. Sodium-free. Reduced-sodium. No salt added. Unsalted. Always check the sodium content even if foods are labeled as low-sodium or no salt added. Buy fresh foods. Cooking Use herbs, seasonings without salt, and spices as substitutes for salt. Use sodium-free baking soda when baking. Grill, braise, or roast foods to add flavor with less salt. Avoid adding salt to pasta, rice, or hot cereals. Drain and rinse canned vegetables, beans, and meat before use. Avoid adding salt when cooking sweets and desserts. Cook with  low-sodium ingredients. What foods are high in sodium? Vegetables Regular canned vegetables (not low-sodium or reduced-sodium). Sauerkraut, pickled vegetables, and relishes. Olives. French fries. Onion rings. Regular canned tomato sauce and paste. Regular tomato and vegetable juice. Frozen vegetables in sauces. Grains Instant hot cereals. Bread stuffing, pancake, and biscuit mixes. Croutons. Seasoned rice or pasta mixes. Noodle soup cups. Boxed or frozen macaroni and cheese. Regular salted crackers. Self-rising flour. Rolls. Bagels. Flour tortillas and wraps. Meats and other proteins Meat or fish that is salted, canned, smoked, cured, spiced, or pickled. This includes bacon, ham, sausages, hot dogs, corned beef, chipped beef, meat loaves, salt pork, jerky, pickled herring, anchovies, regular canned tuna, and sardines. Salted nuts. Dairy Processed cheese and cheese spreads. Cheese curds. Blue cheese. Feta cheese. String cheese. Regular cottage cheese. Buttermilk. Canned milk. The items listed above may not be a complete list of foods high in sodium. Actual amounts of sodium may be different depending on processing. Contact a dietitian for more information. What foods are low in sodium? Fruits Fresh, frozen, or canned fruit with no sauce added. Fruit juice. Vegetables Fresh or frozen vegetables with no sauce added. "No salt added" canned vegetables. "No salt added" tomato sauce and paste. Low-sodium or reduced-sodium tomato and vegetable juice. Grains Noodles, pasta, quinoa, rice. Shredded or puffed wheat or puffed rice. Regular or quick oats (not instant). Low-sodium crackers. Low-sodium bread. Whole-grain bread and whole-grain pasta. Unsalted popcorn. Meats and other proteins Fresh or frozen whole meats, poultry (not injected with sodium), and fish with no sauce added.   Unsalted nuts. Dried peas, beans, and lentils without added salt. Unsalted canned beans. Eggs. Unsalted nut butters. Low-sodium  canned tuna or chicken. Dairy Milk. Soy milk. Yogurt. Low-sodium cheeses, such as Swiss, Monterey Jack, mozzarella, and ricotta. Sherbet or ice cream (keep to  cup per serving). Cream cheese. Fats and oils Unsalted butter or margarine. Other foods Homemade pudding. Sodium-free baking soda and baking powder. Herbs and spices. Low-sodium seasoning mixes. Beverages Coffee and tea. Carbonated beverages. The items listed above may not be a complete list of foods low in sodium. Actual amounts of sodium may be different depending on processing. Contact a dietitian for more information. What are some salt alternatives when cooking? The following are herbs, seasonings, and spices that can be used instead of salt to flavor your food. Herbs should be fresh or dried. Do not choose packaged mixes. Next to the name of the herb, spice, or seasoning are some examples of foods you can pair it with. Herbs Bay leaves - Soups, meat and vegetable dishes, and spaghetti sauce. Basil - Italian dishes, soups, pasta, and fish dishes. Cilantro - Meat, poultry, and vegetable dishes. Chili powder - Marinades and Mexican dishes. Chives - Salad dressings and potato dishes. Cumin - Mexican dishes, couscous, and meat dishes. Dill - Fish dishes, sauces, and salads. Fennel - Meat and vegetable dishes, breads, and cookies. Garlic (do not use garlic salt) - Italian dishes, meat dishes, salad dressings, and sauces. Marjoram - Soups, potato dishes, and meat dishes. Oregano - Pizza and spaghetti sauce. Parsley - Salads, soups, pasta, and meat dishes. Rosemary - Italian dishes, salad dressings, soups, and red meats. 

## 2022-07-07 NOTE — Progress Notes (Signed)
New Patient Office Visit  Subjective    Patient ID: Shelly Hodges, female    DOB: 02/22/1976  Age: 47 y.o. MRN: [id number]  CC:  Chief Complaint  Patient presents with   New Patient (Initial Visit)    HPI Shelly Hodges is a 47 year old female presents to establish care. She has hypertension and just now learning how to season foods. Patient has No headache, No chest pain, No abdominal pain - No Nausea, No new weakness tingling or numbness, No Cough - shortness of breath    Outpatient Encounter Medications as of 07/07/2022  Medication Sig   amLODipine (NORVASC) 5 MG tablet Take 1 tablet (5 mg total) by mouth daily.   cetirizine (ZYRTEC) 10 MG tablet Take 1 tablet (10 mg total) by mouth at bedtime.   fluticasone (FLONASE) 50 MCG/ACT nasal spray Place 1 spray into both nostrils daily.   No facility-administered encounter medications on file as of 07/07/2022.    Past Medical History:  Diagnosis Date   SVD (spontaneous vaginal delivery) 12/03/2015    Past Surgical History:  Procedure Laterality Date   NO PAST SURGERIES      Family History  Problem Relation Age of Onset   Sickle cell trait Maternal Aunt    Sickle cell trait Maternal Aunt    Diabetes Mother    Renal Disease Mother     Social History   Socioeconomic History   Marital status: Significant Other    Spouse name: Not on file   Number of children: Not on file   Years of education: Not on file   Highest education level: Not on file  Occupational History   Not on file  Tobacco Use   Smoking status: Every Day    Packs/day: 1.00    Years: 20.00    Additional pack years: 0.00    Total pack years: 20.00    Types: Cigarettes    Start date: 2003   Smokeless tobacco: Never   Tobacco comments:    Goes through one pack of cig every three days   Vaping Use   Vaping Use: Never used  Substance and Sexual Activity   Alcohol use: Yes    Comment: socially   Drug use: Yes    Types: Marijuana    Sexual activity: Not Currently    Birth control/protection: Injection  Other Topics Concern   Not on file  Social History Narrative   Not on file   Social Determinants of Health   Financial Resource Strain: Not on file  Food Insecurity: No Food Insecurity (07/09/2021)   Hunger Vital Sign    Worried About Running Out of Food in the Last Year: Never true    Ran Out of Food in the Last Year: Never true  Transportation Needs: No Transportation Needs (07/09/2021)   PRAPARE - Administrator, Civil Service (Medical): No    Lack of Transportation (Non-Medical): No  Physical Activity: Not on file  Stress: Not on file  Social Connections: Not on file  Intimate Partner Violence: Not on file    ROS Comprehensive ROS Pertinent positive and negative noted in HPI     Objective    Blood Pressure (Abnormal) 155/88 (BP Location: Left Arm, Patient Position: Sitting, Cuff Size: Normal)   Pulse 80   Height  (1.626 m)   Weight 148 lb 12.8 oz (67.5 kg)   Oxygen Saturation 99%   Body Mass Index 25.54 kg/m   General: No  apparent distress. Eyes: Extraocular eye movements intact, pupils equal and round. Neck: Supple, trachea midline. Thyroid: No enlargement, mobile without fixation, no tenderness. Cardiovascular: Regular rhythm and rate, no murmur, normal radial pulses. Respiratory: Normal respiratory effort, clear to auscultation. Gastrointestinal: Normal pitch active bowel sounds, nontender abdomen without distention or appreciable hepatomegaly. Musculoskeletal: Normal muscle tone, no tenderness on palpation of tibia, no excessive thoracic kyphosis. Skin: Appropriate warmth, no visible rash. Mental status: Alert, conversant, speech clear, thought logical, appropriate mood and affect, no hallucinations or delusions evident. Hematologic/lymphatic: No cervical adenopathy, no visible ecchymoses.     Assessment & Plan:  Shelly Hodges was seen today for new patient (initial  visit).  Diagnoses and all orders for this visit:  Encounter to establish care  Essential hypertension -     CMP14+EGFR; Future  BMI 25.0-25.9,adult  Anemia, unspecified type -     CBC with Differential; Future  Screening for diabetes mellitus -     Hemoglobin A1c; Future  Screening, lipid -     Lipid Panel; Future  Encounter for HCV screening test for low risk patient -     HCV Ab w Reflex to Quant PCR; Future

## 2022-07-28 ENCOUNTER — Encounter (INDEPENDENT_AMBULATORY_CARE_PROVIDER_SITE_OTHER): Payer: Medicaid Other

## 2022-07-28 NOTE — Progress Notes (Signed)
Labs only on Nurse visit

## 2022-07-29 LAB — CMP14+EGFR
ALT: 17 IU/L (ref 0–32)
AST: 15 IU/L (ref 0–40)
Albumin/Globulin Ratio: 1.8 (ref 1.2–2.2)
Albumin: 4.7 g/dL (ref 3.9–4.9)
Alkaline Phosphatase: 74 IU/L (ref 44–121)
BUN/Creatinine Ratio: 11 (ref 9–23)
BUN: 9 mg/dL (ref 6–24)
Bilirubin Total: 0.8 mg/dL (ref 0.0–1.2)
CO2: 23 mmol/L (ref 20–29)
Calcium: 9.5 mg/dL (ref 8.7–10.2)
Chloride: 101 mmol/L (ref 96–106)
Creatinine, Ser: 0.8 mg/dL (ref 0.57–1.00)
Globulin, Total: 2.6 g/dL (ref 1.5–4.5)
Glucose: 103 mg/dL — ABNORMAL HIGH (ref 70–99)
Potassium: 4.1 mmol/L (ref 3.5–5.2)
Sodium: 141 mmol/L (ref 134–144)
Total Protein: 7.3 g/dL (ref 6.0–8.5)
eGFR: 93 mL/min/{1.73_m2} (ref >59)

## 2022-07-29 LAB — HEMOGLOBIN A1C
Est. average glucose Bld gHb Est-mCnc: 120 mg/dL
Hgb A1c MFr Bld: 5.8 % — ABNORMAL HIGH (ref 4.8–5.6)

## 2022-07-29 LAB — LIPID PANEL
Chol/HDL Ratio: 4.6 ratio — ABNORMAL HIGH (ref 0.0–4.4)
Cholesterol, Total: 232 mg/dL — ABNORMAL HIGH (ref 100–199)
HDL: 50 mg/dL (ref >39)
LDL Chol Calc (NIH): 140 mg/dL — ABNORMAL HIGH (ref 0–99)
Triglycerides: 233 mg/dL — ABNORMAL HIGH (ref 0–149)
VLDL Cholesterol Cal: 42 mg/dL — ABNORMAL HIGH (ref 5–40)

## 2022-07-29 LAB — HCV INTERPRETATION

## 2022-07-29 LAB — HCV AB W REFLEX TO QUANT PCR: HCV Ab: NONREACTIVE

## 2022-08-02 ENCOUNTER — Other Ambulatory Visit (INDEPENDENT_AMBULATORY_CARE_PROVIDER_SITE_OTHER): Payer: Self-pay | Admitting: Primary Care

## 2022-08-02 DIAGNOSIS — E782 Mixed hyperlipidemia: Secondary | ICD-10-CM

## 2022-08-02 MED ORDER — ATORVASTATIN CALCIUM 80 MG PO TABS
80.0000 mg | ORAL_TABLET | Freq: Every day | ORAL | 3 refills | Status: AC
Start: 2022-08-02 — End: ?

## 2022-08-04 ENCOUNTER — Other Ambulatory Visit: Payer: Self-pay | Admitting: *Deleted

## 2022-08-04 DIAGNOSIS — J301 Allergic rhinitis due to pollen: Secondary | ICD-10-CM

## 2022-08-04 NOTE — Telephone Encounter (Signed)
Received a fax for refill of Cetirizine. Will forward to provider. Nancy Fetter

## 2022-08-29 ENCOUNTER — Ambulatory Visit: Payer: Medicaid Other

## 2022-09-05 ENCOUNTER — Ambulatory Visit (INDEPENDENT_AMBULATORY_CARE_PROVIDER_SITE_OTHER): Payer: Medicaid Other | Admitting: *Deleted

## 2022-09-05 ENCOUNTER — Other Ambulatory Visit: Payer: Self-pay

## 2022-09-05 VITALS — BP 131/86 | HR 79 | Ht 64.0 in | Wt 153.0 lb

## 2022-09-05 DIAGNOSIS — Z3042 Encounter for surveillance of injectable contraceptive: Secondary | ICD-10-CM

## 2022-09-05 MED ORDER — MEDROXYPROGESTERONE ACETATE 150 MG/ML IM SUSY
150.0000 mg | PREFILLED_SYRINGE | Freq: Once | INTRAMUSCULAR | Status: AC
Start: 2022-09-05 — End: 2022-09-05
  Administered 2022-09-05: 150 mg via INTRAMUSCULAR

## 2022-09-05 NOTE — Progress Notes (Signed)
Shelly Hodges here for Depo-Provera  Injection.  Injection administered without complication. Patient will return in 3 months for next injection. She reports since last injectiion she was bleeding toward end of April  and then off 2 weeks and bled again for until 2 weeks and then it stopped again. I advised she schedule her annual gyn exam which she is late for  and dicuss with provider and may be time to consider changing methods if this coninues. She voices understanding. Bonita Quin [facility] 09/05/2022  10:19 AM

## 2022-10-01 IMAGING — US US OB < 14 WEEKS - US OB TV
1 series · 15 of 28 positions shown · non-contrast
Comparison: None.

CLINICAL DATA: Vaginal bleeding for several days

EXAM:
OBSTETRIC <14 WK US AND TRANSVAGINAL OB US
TECHNIQUE: Both transabdominal and transvaginal ultrasound examinations were
performed for complete evaluation of the gestation as well as the
maternal uterus, adnexal regions, and pelvic cul-de-sac.
Transvaginal technique was performed to assess early pregnancy.

[Series 1: us ob < 14 weeks - us ob tv · 15 of 53 slices shown]
[im 1/53]
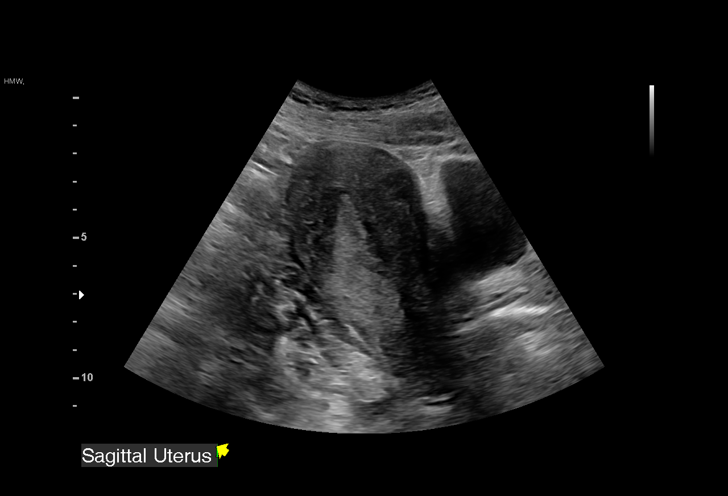
[im 4/53]
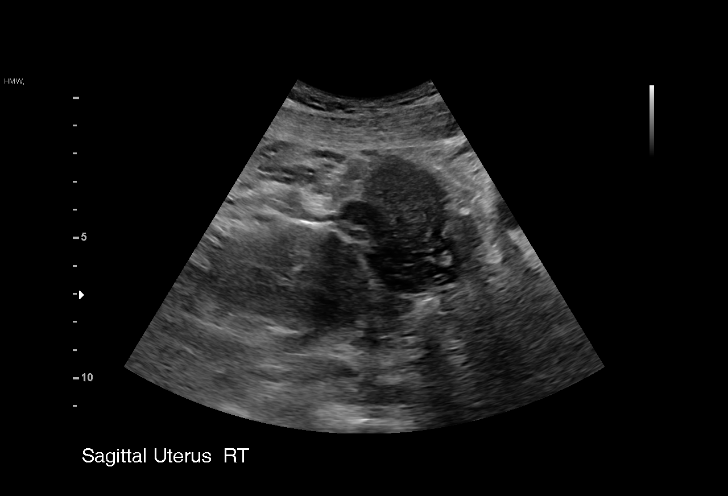
[im 8/53]
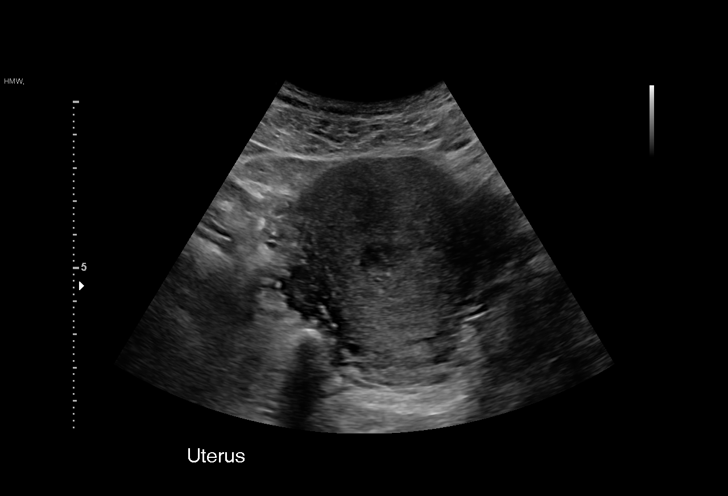
[im 12/53]
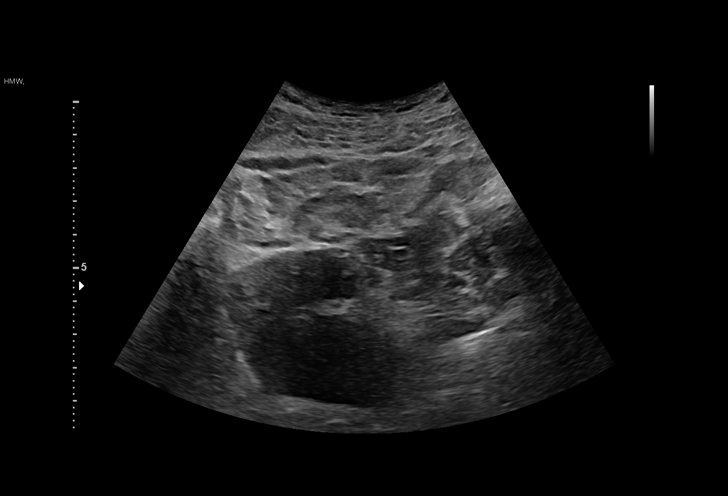
[im 16/53]
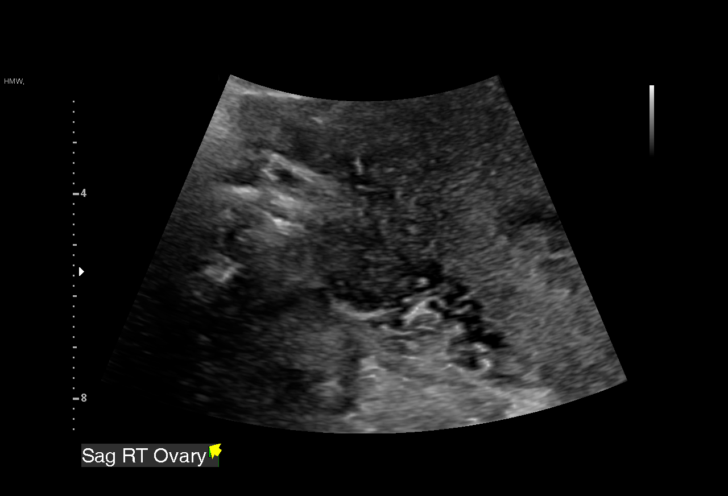
[im 20/53]
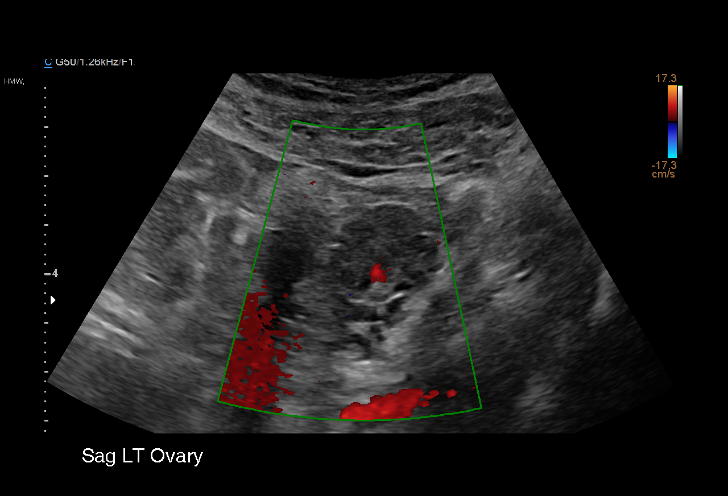
[im 24/53]
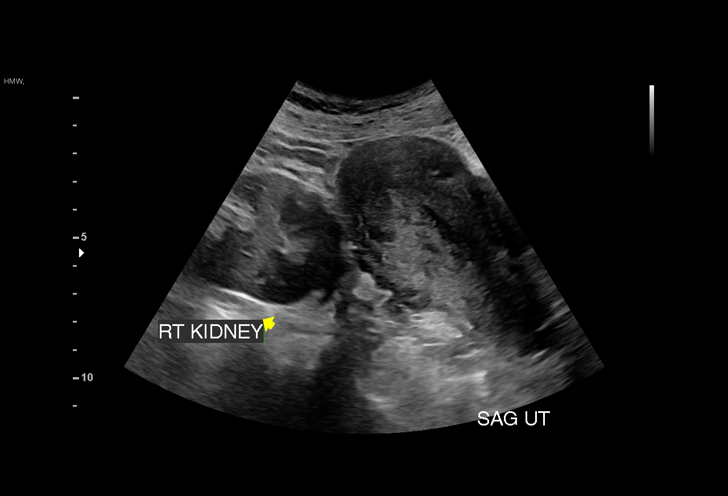
[im 27/53]
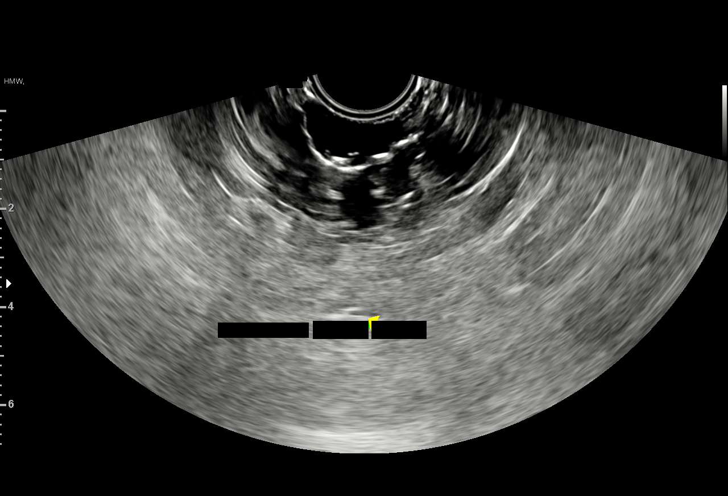
[im 29/53]
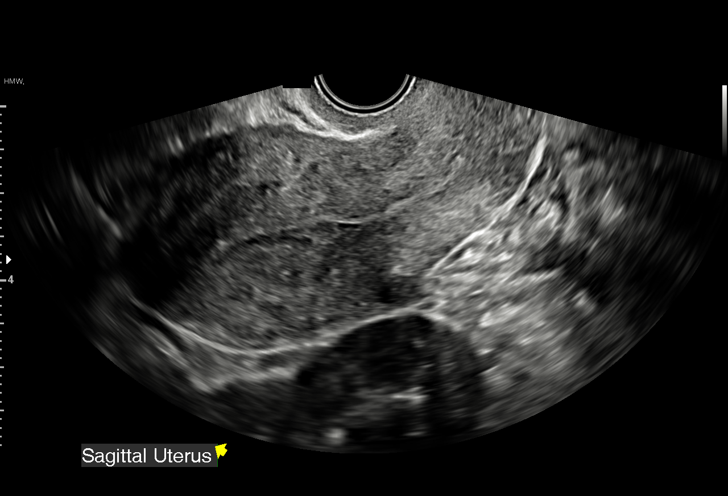
[im 33/53]
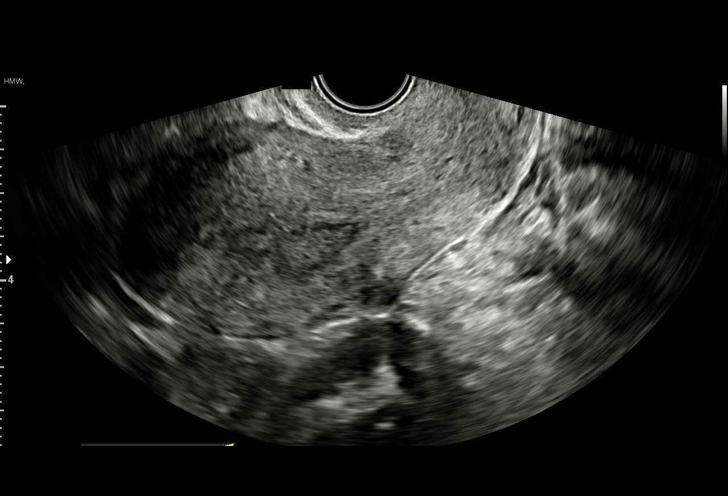
[im 37/53]
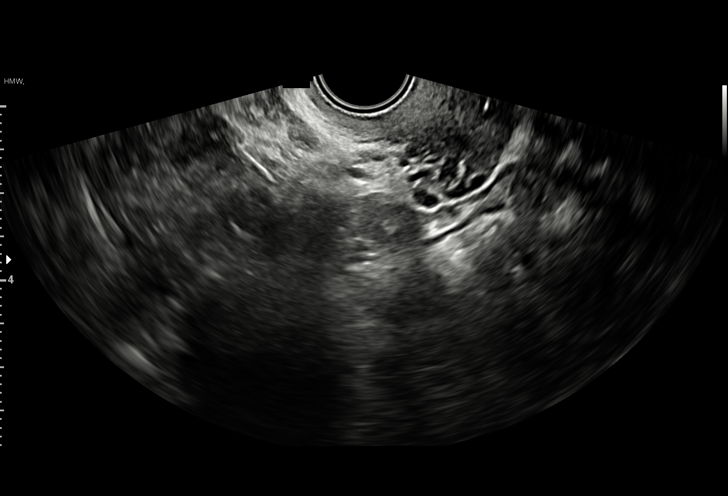
[im 41/53]
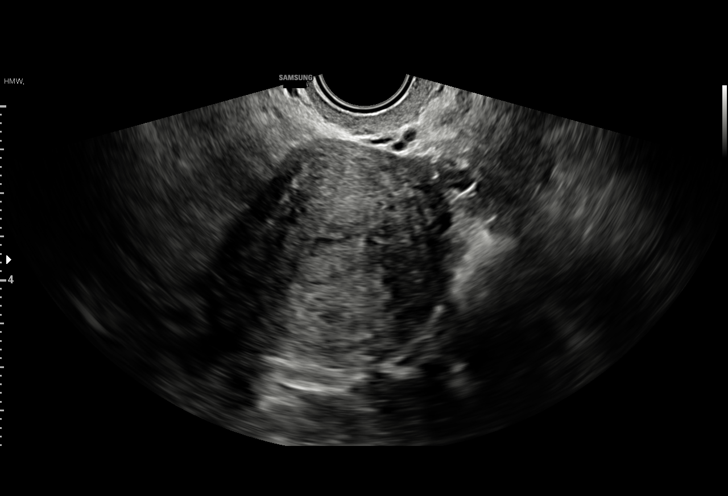
[im 45/53]
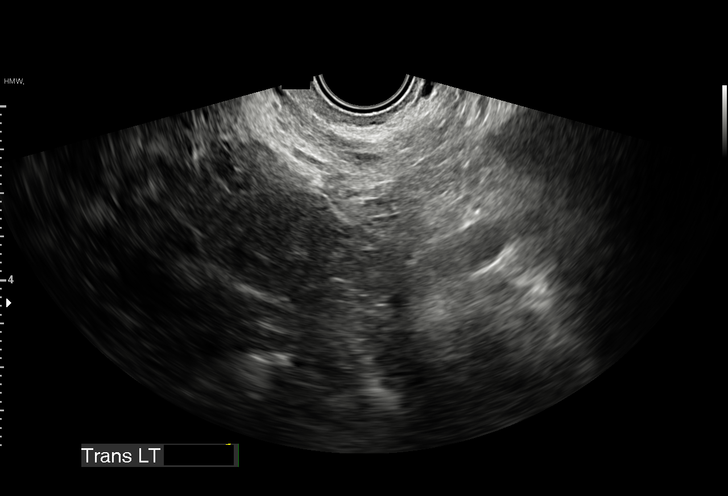
[im 49/53]
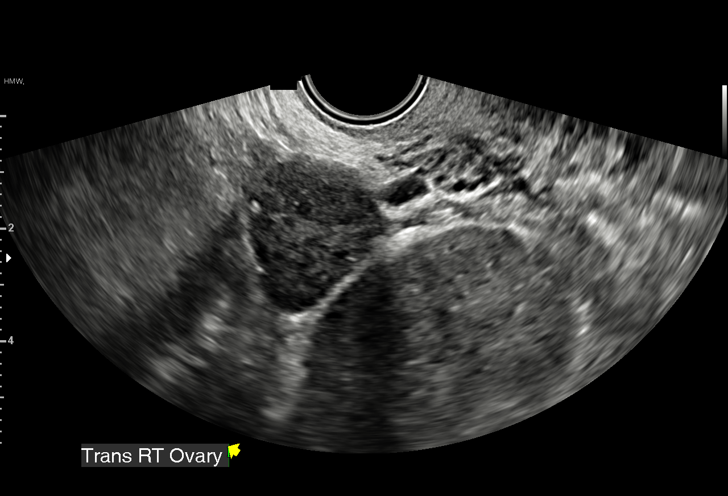
[im 53/53]
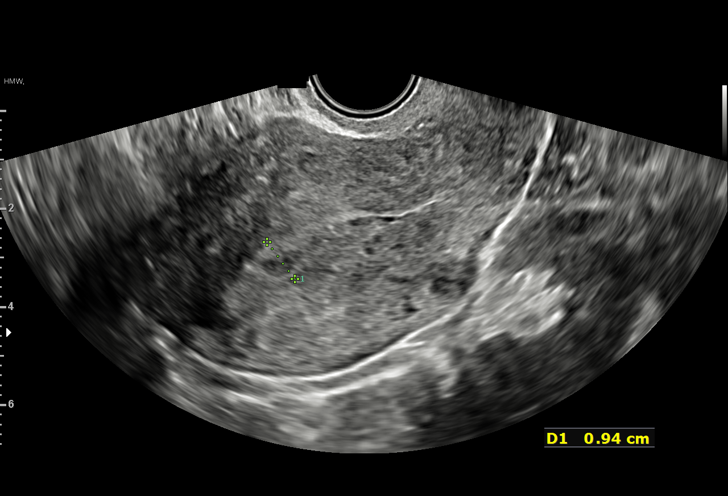

[15 of 28 positions shown; findings below may reference images not displayed]

FINDINGS: Intrauterine gestational sac: None

Yolk sac:  Not Visualized.

Embryo:  Not Visualized.

Cardiac Activity: Not Visualized.

Maternal uterus/adnexae: Uterus is anteverted. Endometrium measures
9 mm and is unremarkable. Right ovary measures 2.6 x 2.8 x 1.7 cm
and the left ovary measures 1.9 x 2.4 x 2.1 cm. No free fluid or
pelvic mass.

Incidental note is made of an ectopic kidney located in the right
lower quadrant.
IMPRESSION: 1. No evidence of intrauterine pregnancy at this time. Serial beta
HCG measurements and follow-up ultrasound may be needed to document
a live intrauterine pregnancy.
2. Incidental pelvic kidney.

## 2022-11-28 ENCOUNTER — Other Ambulatory Visit: Payer: Self-pay

## 2022-11-28 ENCOUNTER — Encounter: Payer: Self-pay | Admitting: Advanced Practice Midwife

## 2022-11-28 ENCOUNTER — Ambulatory Visit: Payer: No Typology Code available for payment source | Admitting: Advanced Practice Midwife

## 2022-11-28 VITALS — BP 171/76 | HR 82 | Ht 64.0 in | Wt 157.7 lb

## 2022-11-28 DIAGNOSIS — Z01419 Encounter for gynecological examination (general) (routine) without abnormal findings: Secondary | ICD-10-CM | POA: Diagnosis not present

## 2022-11-28 DIAGNOSIS — Z3042 Encounter for surveillance of injectable contraceptive: Secondary | ICD-10-CM

## 2022-11-28 DIAGNOSIS — Z1231 Encounter for screening mammogram for malignant neoplasm of breast: Secondary | ICD-10-CM

## 2022-11-28 DIAGNOSIS — Z3009 Encounter for other general counseling and advice on contraception: Secondary | ICD-10-CM

## 2022-11-28 DIAGNOSIS — I10 Essential (primary) hypertension: Secondary | ICD-10-CM

## 2022-11-28 MED ORDER — MEDROXYPROGESTERONE ACETATE 150 MG/ML IM SUSY
150.0000 mg | PREFILLED_SYRINGE | Freq: Once | INTRAMUSCULAR | Status: AC
Start: 2022-11-28 — End: 2022-11-28
  Administered 2022-11-28: 150 mg via INTRAMUSCULAR

## 2022-11-28 NOTE — Progress Notes (Unsigned)
Subjective:     Shelly Hodges is a 47 y.o. female here at [facility] for a routine exam.  Current complaints: desire to switch from Depo to another birth control, pt has read about bone density and Depo.  Personal health history reviewed: yes.  No family hx osteoporosis or hip fracture.   Do you have a primary care provider? yes Do you feel safe at home? yes  [employer] Visit from 11/28/2022 in [facility] [employer] at [facility]  PHQ-2 Total Score 0       Health Maintenance Due  Topic Date Due   Colonoscopy  Never done   INFLUENZA VACCINE  Never done   COVID-19 Vaccine (1 - 2023-24 season) Never done     Risk factors for chronic health problems: Smoking: Alchohol/how much: Pt BMI: Body mass index is 27.07 kg/m.   Gynecologic History Patient's last menstrual period was 11/21/2022 (approximate). Contraception: Depo-Provera injections Last Pap: 07/09/21. Results were: normal Last mammogram: 2023. Results were: normal  Obstetric History OB History  Gravida Para Term Preterm AB Living  4 2 2  0 2 2  SAB IAB Ectopic Multiple Live Births  2 0 0 0 2    # Outcome Date GA Lbr Len/2nd Weight Sex Type Anes PTL Lv  4 Term 12/02/15 [redacted]w[redacted]d 06:07 / 00:20 7 lb 6.3 oz (3.354 kg) M Vag-Spont EPI  LIV  3 Term 10/20/95 [redacted]w[redacted]d  5 lb 11 oz (2.58 kg) M Vag-Spont EPI  LIV  2 SAB           1 SAB              The following portions of the patient's history were reviewed and updated as appropriate: allergies, current medications, past family history, past medical history, past social history, past surgical history, and problem list.  Review of Systems Pertinent items noted in HPI and remainder of comprehensive ROS otherwise negative.    Objective:  BP (!) 171/76   Pulse 82   Ht 5\' 4"  (1.626 m)   Wt 157 lb 11.2 oz (71.5 kg)   LMP 11/21/2022 (Approximate) Comment: Starts spotting around 9/2, irregular periods from Depo  BMI 27.07 kg/m   VS  reviewed, nursing note reviewed,  Constitutional: well developed, well nourished, no distress HEENT: normocephalic, thyroid without enlargement or mass HEART: RRR, no murmurs rubs/gallops RESP: clear and equal to auscultation bilaterally in all lobes  Breast Exam: Deferred. Pt had exam with PCP this year, mammogram ordered.  Recommend self exams at home and annual CBE in the office.  Abdomen: soft Neuro: alert and oriented x 3 Skin: warm, dry Psych: affect normal Pelvic exam: Deferred     Assessment/Plan:    1. Visit for screening mammogram  - MM 3D SCREENING MAMMOGRAM BILATERAL BREAST; Future  2. Encounter for annual routine gynecological examination   3. Essential hypertension --Pt is taking HTN meds as prescribed but BPs are elevated at home as well --She did take medication late this morning --Last PCP follow up 03/2022 --Pt to make appt to follow up with PCP, pt to get BP cuff for home use and keep log    4. Encounter for counseling regarding contraception --Discussed pt contraceptive plans and reviewed contraceptive methods based on pt preferences and effectiveness.  Pt prefers Depo today but would like to schedule IUD insertion before her next Depo.   Return in about 4 weeks (around 12/26/2022) for Gyn follow up for contraception.  Sharen Counter, CNM 5:37 PM

## 2022-11-28 NOTE — Progress Notes (Unsigned)
Scheduled patient for MM for 9/26 @ 4:40 PM. Pt acknowledge appointment at the [facility].

## 2022-11-29 DIAGNOSIS — I10 Essential (primary) hypertension: Secondary | ICD-10-CM | POA: Insufficient documentation

## 2022-12-15 ENCOUNTER — Ambulatory Visit: Payer: No Typology Code available for payment source

## 2023-01-11 ENCOUNTER — Other Ambulatory Visit: Payer: Self-pay

## 2023-01-11 ENCOUNTER — Encounter: Payer: Self-pay | Admitting: Obstetrics and Gynecology

## 2023-01-11 ENCOUNTER — Ambulatory Visit (INDEPENDENT_AMBULATORY_CARE_PROVIDER_SITE_OTHER): Payer: No Typology Code available for payment source | Admitting: Obstetrics and Gynecology

## 2023-01-11 VITALS — BP 127/86 | HR 86 | Wt 160.9 lb

## 2023-01-11 DIAGNOSIS — Z975 Presence of (intrauterine) contraceptive device: Secondary | ICD-10-CM | POA: Insufficient documentation

## 2023-01-11 DIAGNOSIS — Z30430 Encounter for insertion of intrauterine contraceptive device: Secondary | ICD-10-CM

## 2023-01-11 DIAGNOSIS — Z3202 Encounter for pregnancy test, result negative: Secondary | ICD-10-CM

## 2023-01-11 DIAGNOSIS — Z1330 Encounter for screening examination for mental health and behavioral disorders, unspecified: Secondary | ICD-10-CM

## 2023-01-11 LAB — POCT PREGNANCY, URINE: Preg Test, Ur: NEGATIVE

## 2023-01-11 MED ORDER — LEVONORGESTREL 20.1 MCG/DAY IU IUD
1.0000 | INTRAUTERINE_SYSTEM | Freq: Once | INTRAUTERINE | Status: AC
Start: 2023-01-11 — End: 2023-01-11
  Administered 2023-01-11: 1 via INTRAUTERINE

## 2023-01-11 NOTE — Progress Notes (Unsigned)
GYNECOLOGY VISIT  Patient name: Shelly Hodges MRN [id number]  Date of birth: Mar 08, 1976 Chief Complaint:   Contraception  History:  Shelly Hodges is a 47 y.o. [phone] being seen today for IUD inseriton. Tired of coming in to get shot. Has been mostly been amenorrheic with depo, will have bleeding before her next injection is due. Menses with out depo are typically heavy and lasts for about 3-4 days.  She does not want to have any more children. She has not had an IUD in before. Wants to be sure her partner will not feel the strings.   Past Medical History:  Diagnosis Date   SVD (spontaneous vaginal delivery) 12/03/2015    Past Surgical History:  Procedure Laterality Date   NO PAST SURGERIES      The following portions of the patient's history were reviewed and updated as appropriate: allergies, current medications, past family history, past medical history, past social history, past surgical history and problem list.   Health Maintenance:   Last pap     Component Value Date/Time   DIAGPAP  07/09/2021 1042    - Negative for intraepithelial lesion or malignancy (NILM)   HPVHIGH Negative 07/09/2021 1042   ADEQPAP  07/09/2021 1042    Satisfactory for evaluation; transformation zone component PRESENT.    High Risk HPV: Positive  Adequacy:  Satisfactory for evaluation, transformation zone component PRESENT  Diagnosis:  Atypical squamous cells of undetermined significance (ASC-US)  Last mammogram: ordered, needs to be re-scheduled   Review of Systems:  Pertinent items are noted in HPI. Comprehensive review of systems was otherwise negative.   Objective:  Physical Exam BP 127/86   Pulse 86   Wt 160 lb 14.4 oz (73 kg)   BMI 27.62 kg/m    Physical Exam Vitals and nursing note reviewed. Exam conducted with a chaperone present.  Constitutional:      Appearance: Normal appearance.  HENT:     Head: Normocephalic and atraumatic.  Pulmonary:     Effort:  Pulmonary effort is normal.  Genitourinary:    General: Normal vulva.     Exam position: Lithotomy position.  Skin:    General: Skin is warm and dry.  Neurological:     General: No focal deficit present.     Mental Status: She is alert.  Psychiatric:        Mood and Affect: Mood normal.        Behavior: Behavior normal.        Thought Content: Thought content normal.        Judgment: Judgment normal.    IUD Insertion Procedure Note Patient identified, informed consent performed, consent signed.   Discussed risks of irregular bleeding, cramping, infection, malpositioning or misplacement of the IUD outside the uterus which may require further procedure such as laparoscopy. Also discussed >99% contraception efficacy, increased risk of ectopic pregnancy with failure of method.  Time out was performed.  Urine pregnancy test negative.  Speculum placed in the vagina.  Cervix visualized.  Cleaned with Betadine x 2.  Anterior cervix grasped with a single tooth tenaculum.  Paracervical block was administered.  Liletta IUD placed per manufacturer's recommendations.  Strings trimmed to 3 cm. Tenaculum was removed, good hemostasis noted.  Patient tolerated procedure well.   Patient was given post-procedure instructions.  She was advised to have backup contraception for one week.  Patient was also asked to check IUD strings periodically and follow up prn for IUD check.  Assessment & Plan:   1. Encounter for IUD insertion 2. IUD (intrauterine device) in place Now s/p uncomplicated IUD insertion. Reviewed possible bleeding pattern changes with IUD. Last depo shot w/in the last 12 weeks, no additional contraceptive measures for the first week.   Routine preventative health maintenance measures emphasized.  Lorriane Shire, MD Minimally Invasive Gynecologic Surgery [facility], [facility]

## 2023-02-19 ENCOUNTER — Other Ambulatory Visit (INDEPENDENT_AMBULATORY_CARE_PROVIDER_SITE_OTHER): Payer: Self-pay | Admitting: Primary Care

## 2023-02-19 DIAGNOSIS — I10 Essential (primary) hypertension: Secondary | ICD-10-CM

## 2023-02-22 ENCOUNTER — Other Ambulatory Visit (INDEPENDENT_AMBULATORY_CARE_PROVIDER_SITE_OTHER): Payer: Self-pay | Admitting: Primary Care

## 2023-02-22 ENCOUNTER — Encounter: Payer: Self-pay | Admitting: *Deleted

## 2023-02-22 DIAGNOSIS — I10 Essential (primary) hypertension: Secondary | ICD-10-CM

## 2023-02-22 NOTE — Telephone Encounter (Signed)
Given a 30 day courtesy supply of amlodipine 10 mg.   Message sent via MyChart to schedule her 6 month check up.

## 2023-04-04 ENCOUNTER — Ambulatory Visit: Payer: No Typology Code available for payment source | Admitting: Obstetrics and Gynecology

## 2023-05-03 ENCOUNTER — Other Ambulatory Visit: Payer: Self-pay

## 2023-05-03 ENCOUNTER — Ambulatory Visit: Payer: No Typology Code available for payment source | Admitting: Obstetrics and Gynecology

## 2023-05-03 VITALS — BP 149/101 | HR 76 | Wt 168.0 lb

## 2023-05-03 DIAGNOSIS — Z975 Presence of (intrauterine) contraceptive device: Secondary | ICD-10-CM

## 2023-05-03 NOTE — Progress Notes (Signed)
    GYNECOLOGY VISIT  Patient name: Shelly Hodges MRN [id number]  Date of birth: [birth year] Chief Complaint:   Gynecologic Exam   History:  Shelly Hodges is a 48 y.o. [id number] being seen today for pain with IUD in place. IUD placed 01/11/23 due to not wanting to come in for depo and desiring LARC. Since insertion has been having pain and occasional spotting. Will take 2 advil when the pain is severe. Pain is cramping and will come on suddenly. Very light bleeding occasionally.   Past Medical History:  Diagnosis Date   SVD (spontaneous vaginal delivery) 12/03/2015    Past Surgical History:  Procedure Laterality Date   NO PAST SURGERIES      The following portions of the patient's history were reviewed and updated as appropriate: allergies, current medications, past family history, past medical history, past social history, past surgical history and problem list.   Health Maintenance:   Last pap     Component Value Date/Time   DIAGPAP  07/09/2021 1042    - Negative for intraepithelial lesion or malignancy (NILM)   HPVHIGH Negative 07/09/2021 1042   ADEQPAP  07/09/2021 1042    Satisfactory for evaluation; transformation zone component PRESENT.    High Risk HPV: Positive  Adequacy:  Satisfactory for evaluation, transformation zone component PRESENT  Diagnosis:  Atypical squamous cells of undetermined significance (ASC-US)  Last mammogram: n/a   Review of Systems:  Pertinent items are noted in HPI. Comprehensive review of systems was otherwise negative.   Objective:  Physical Exam BP (!) 149/101   Pulse 76   Wt 168 lb (76.2 kg)   BMI 28.84 kg/m    Physical Exam Vitals and nursing note reviewed. Exam conducted with a chaperone present.  Constitutional:      Appearance: Normal appearance.  HENT:     Head: Normocephalic and atraumatic.  Pulmonary:     Effort: Pulmonary effort is normal.     Breath sounds: Normal breath sounds.  Genitourinary:     General: Normal vulva.     Exam position: Lithotomy position.     Vagina: Normal.     Cervix: Normal.     Comments: IUD strings not visualized Skin:    General: Skin is warm and dry.  Neurological:     General: No focal deficit present.     Mental Status: She is alert.  Psychiatric:        Mood and Affect: Mood normal.        Behavior: Behavior normal.        Thought Content: Thought content normal.        Judgment: Judgment normal.          Assessment & Plan:   1. IUD (intrauterine device) in place (Primary) IUD strings not seen but IUD appears to be within cavity, will get formal ultrasound to confirm position. Advised take NSAIDs when pain starts and not waiting until severe. If IUD incorrectly positioned, will remove and she would opt to resume depo. If confirmed within cavity an consider doxy for presumed/possible endometritis.  - US PELVIS TRANSVAGINAL NON-OB (TV ONLY); Future   Routine preventative health maintenance measures emphasized.  Lorriane Shire, MD Minimally Invasive Gynecologic Surgery Center for [facility], [facility]

## 2023-05-10 ENCOUNTER — Ambulatory Visit (HOSPITAL_COMMUNITY)
Admission: RE | Admit: 2023-05-10 | Discharge: 2023-05-10 | Disposition: A | Discharge status: Home / Self Care | Payer: Managed Care, Other (non HMO) | Source: Ambulatory Visit | Attending: Obstetrics and Gynecology | Admitting: Obstetrics and Gynecology

## 2023-05-10 DIAGNOSIS — Z975 Presence of (intrauterine) contraceptive device: Secondary | ICD-10-CM | POA: Insufficient documentation

## 2023-05-11 ENCOUNTER — Ambulatory Visit (HOSPITAL_COMMUNITY): Payer: Managed Care, Other (non HMO)

## 2023-05-12 ENCOUNTER — Encounter: Payer: Self-pay | Admitting: Obstetrics and Gynecology

## 2023-05-18 ENCOUNTER — Telehealth (INDEPENDENT_AMBULATORY_CARE_PROVIDER_SITE_OTHER): Payer: Self-pay | Admitting: Primary Care

## 2023-05-18 ENCOUNTER — Ambulatory Visit (INDEPENDENT_AMBULATORY_CARE_PROVIDER_SITE_OTHER): Payer: No Typology Code available for payment source | Admitting: Primary Care

## 2023-05-18 NOTE — Telephone Encounter (Signed)
 Called pt to make another atp. I was able to schedule a different atp. If time and date do not work but if a different date and time is needed. Please call [facility].

## 2023-05-18 NOTE — Telephone Encounter (Signed)
 Called pt to see if they were interested in doing a virtual visit. Since atp was missed.

## 2023-05-24 ENCOUNTER — Telehealth (INDEPENDENT_AMBULATORY_CARE_PROVIDER_SITE_OTHER): Payer: Self-pay | Admitting: Primary Care

## 2023-05-24 NOTE — Telephone Encounter (Signed)
 Called pt to remind them about appt. VM was left for pt

## 2023-05-25 ENCOUNTER — Ambulatory Visit (INDEPENDENT_AMBULATORY_CARE_PROVIDER_SITE_OTHER): Payer: No Typology Code available for payment source | Admitting: Primary Care

## 2023-05-25 ENCOUNTER — Encounter (INDEPENDENT_AMBULATORY_CARE_PROVIDER_SITE_OTHER): Payer: Self-pay

## 2023-05-30 ENCOUNTER — Other Ambulatory Visit (INDEPENDENT_AMBULATORY_CARE_PROVIDER_SITE_OTHER): Payer: Self-pay | Admitting: Primary Care

## 2023-05-30 DIAGNOSIS — I10 Essential (primary) hypertension: Secondary | ICD-10-CM

## 2024-02-22 ENCOUNTER — Other Ambulatory Visit: Payer: Self-pay | Admitting: Medical Genetics
